# Patient Record
Sex: Male | Born: 1970 | Race: White | Hispanic: No | Smoking: Current every day smoker
Health system: Southern US, Community
[De-identification: ages and names within clinical notes are randomized; demographics above are authoritative.]

---

## 2008-03-23 ENCOUNTER — Other Ambulatory Visit: Payer: Self-pay

## 2008-03-23 ENCOUNTER — Emergency Department: Payer: Self-pay | Admitting: Emergency Medicine

## 2010-02-02 ENCOUNTER — Emergency Department: Payer: Self-pay | Admitting: Emergency Medicine

## 2011-05-22 ENCOUNTER — Emergency Department: Payer: Self-pay | Admitting: Emergency Medicine

## 2013-06-29 ENCOUNTER — Emergency Department: Payer: Self-pay | Admitting: Emergency Medicine

## 2013-08-31 ENCOUNTER — Encounter (HOSPITAL_COMMUNITY): Payer: Self-pay | Admitting: Emergency Medicine

## 2013-08-31 ENCOUNTER — Emergency Department (HOSPITAL_COMMUNITY)
Admission: EM | Admit: 2013-08-31 | Discharge: 2013-08-31 | Disposition: A | Discharge status: Home / Self Care | Payer: Self-pay | Attending: Emergency Medicine | Admitting: Emergency Medicine

## 2013-08-31 ENCOUNTER — Emergency Department: Payer: Self-pay | Admitting: Emergency Medicine

## 2013-08-31 DIAGNOSIS — M545 Low back pain, unspecified: Secondary | ICD-10-CM | POA: Insufficient documentation

## 2013-08-31 DIAGNOSIS — Z888 Allergy status to other drugs, medicaments and biological substances status: Secondary | ICD-10-CM | POA: Insufficient documentation

## 2013-08-31 DIAGNOSIS — F17200 Nicotine dependence, unspecified, uncomplicated: Secondary | ICD-10-CM | POA: Insufficient documentation

## 2013-08-31 DIAGNOSIS — G8929 Other chronic pain: Secondary | ICD-10-CM | POA: Insufficient documentation

## 2013-08-31 DIAGNOSIS — M5430 Sciatica, unspecified side: Secondary | ICD-10-CM | POA: Insufficient documentation

## 2013-08-31 DIAGNOSIS — Z79899 Other long term (current) drug therapy: Secondary | ICD-10-CM | POA: Insufficient documentation

## 2013-08-31 DIAGNOSIS — Y9389 Activity, other specified: Secondary | ICD-10-CM | POA: Insufficient documentation

## 2013-08-31 DIAGNOSIS — Y929 Unspecified place or not applicable: Secondary | ICD-10-CM | POA: Insufficient documentation

## 2013-08-31 DIAGNOSIS — M79609 Pain in unspecified limb: Secondary | ICD-10-CM | POA: Insufficient documentation

## 2013-08-31 DIAGNOSIS — X500XXA Overexertion from strenuous movement or load, initial encounter: Secondary | ICD-10-CM | POA: Insufficient documentation

## 2013-08-31 MED ORDER — TRAMADOL HCL 50 MG PO TABS: ORAL_TABLET | ORAL | Status: DC

## 2013-08-31 MED ORDER — OXYCODONE-ACETAMINOPHEN 5-325 MG PO TABS: 1.0000 | ORAL_TABLET | ORAL | Status: DC | PRN

## 2013-08-31 MED ORDER — ONDANSETRON HCL 4 MG PO TABS: 4.0000 mg | ORAL_TABLET | Freq: Four times a day (QID) | ORAL | Status: DC

## 2013-08-31 MED ORDER — DICLOFENAC SODIUM 75 MG PO TBEC: 75.0000 mg | DELAYED_RELEASE_TABLET | Freq: Two times a day (BID) | ORAL | Status: DC

## 2013-08-31 MED ORDER — METHOCARBAMOL 500 MG PO TABS: 500.0000 mg | ORAL_TABLET | Freq: Three times a day (TID) | ORAL | Status: DC

## 2013-08-31 MED ORDER — DEXAMETHASONE 4 MG PO TABS: ORAL_TABLET | ORAL | Status: DC

## 2013-08-31 NOTE — ED Notes (Signed)
Pt was picking up water heater 3 weeks ago and injured lower back and reports pain radiates down right leg

## 2013-08-31 NOTE — ED Notes (Signed)
Pt seen by PA and ready to be seen .

## 2013-08-31 NOTE — ED Notes (Signed)
Pt upset about the rx s that are ordered,Says muscle relaxants do not help.  Threw his d/c papers down and left.

## 2013-08-31 NOTE — Discharge Instructions (Signed)
Heat to your back may be helpful. Please see [facility] at [facility] to establish a PCP, they can manage you condition and pain. Use medications as suggested. Tramadol may cause drowsiness, use with caution. Back Pain, Adult Back pain is very common. The pain often gets better over time. The cause of back pain is usually not dangerous. Most people can learn to manage their back pain on their own.  HOME CARE   Stay active. Start with short walks on flat ground if you can. Try to walk farther each day.  Do not sit, drive, or stand in one place for more than 30 minutes. Do not stay in bed.  Do not avoid exercise or work. Activity can help your back heal faster.  Be careful when you bend or lift an object. Bend at your knees, keep the object close to you, and do not twist.  Sleep on a firm mattress. Lie on your side, and bend your knees. If you lie on your back, put a pillow under your knees.  Only take medicines as told by your doctor.  Put ice on the injured area.  Put ice in a plastic bag.  Place a towel between your skin and the bag.  Leave the ice on for 15-20 minutes, 03-04 times a day for the first 2 to 3 days. After that, you can switch between ice and heat packs.  Ask your doctor about back exercises or massage.  Avoid feeling anxious or stressed. Find good ways to deal with stress, such as exercise. GET HELP RIGHT AWAY IF:   Your pain does not go away with rest or medicine.  Your pain does not go away in 1 week.  You have new problems.  You do not feel well.  The pain spreads into your legs.  You cannot control when you poop (bowel movement) or pee (urinate).  Your arms or legs feel weak or lose feeling (numbness).  You feel sick to your stomach (nauseous) or throw up (vomit).  You have belly (abdominal) pain.  You feel like you may pass out (faint). MAKE SURE YOU:   Understand these instructions.  Will watch your condition.  Will get help right away  if you are not doing well or get worse. Document Released: 01/16/2008 Document Revised: 10/22/2011 Document Reviewed: 12/18/2010 [facility] Patient Information 2014 [place], [place].

## 2013-08-31 NOTE — Discharge Instructions (Signed)
Back Pain, Adult Low back pain is very common. About 1 in 5 people have back pain.The cause of low back pain is rarely dangerous. The pain often gets better over time.About half of people with a sudden onset of back pain feel better in just 2 weeks. About 8 in 10 people feel better by 6 weeks.  CAUSES Some common causes of back pain include:  Strain of the muscles or ligaments supporting the spine.  Wear and tear (degeneration) of the spinal discs.  Arthritis.  Direct injury to the back. DIAGNOSIS Most of the time, the direct cause of low back pain is not known.However, back pain can be treated effectively even when the exact cause of the pain is unknown.Answering your caregiver's questions about your overall health and symptoms is one of the most accurate ways to make sure the cause of your pain is not dangerous. If your caregiver needs more information, he or she may order lab work or imaging tests (X-rays or MRIs).However, even if imaging tests show changes in your back, this usually does not require surgery. HOME CARE INSTRUCTIONS For many people, back pain returns.Since low back pain is rarely dangerous, it is often a condition that people can learn to manageon their own.   Remain active. It is stressful on the back to sit or stand in one place. Do not sit, drive, or stand in one place for more than 30 minutes at a time. Take short walks on level surfaces as soon as pain allows.Try to increase the length of time you walk each day.  Do not stay in bed.Resting more than 1 or 2 days can delay your recovery.  Do not avoid exercise or work.Your body is made to move.It is not dangerous to be active, even though your back may hurt.Your back will likely heal faster if you return to being active before your pain is gone.  Pay attention to your body when you bend and lift. Many people have less discomfortwhen lifting if they bend their knees, keep the load close to their bodies,and  avoid twisting. Often, the most comfortable positions are those that put less stress on your recovering back.  Find a comfortable position to sleep. Use a firm mattress and lie on your side with your knees slightly bent. If you lie on your back, put a pillow under your knees.  Only take over-the-counter or prescription medicines as directed by your caregiver. Over-the-counter medicines to reduce pain and inflammation are often the most helpful.Your caregiver may prescribe muscle relaxant drugs.These medicines help dull your pain so you can more quickly return to your normal activities and healthy exercise.  Put ice on the injured area.  Put ice in a plastic bag.  Place a towel between your skin and the bag.  Leave the ice on for 15-20 minutes, 03-04 times a day for the first 2 to 3 days. After that, ice and heat may be alternated to reduce pain and spasms.  Ask your caregiver about trying back exercises and gentle massage. This may be of some benefit.  Avoid feeling anxious or stressed.Stress increases muscle tension and can worsen back pain.It is important to recognize when you are anxious or stressed and learn ways to manage it.Exercise is a great option. SEEK MEDICAL CARE IF:  You have pain that is not relieved with rest or medicine.  You have pain that does not improve in 1 week.  You have new symptoms.  You are generally not feeling well. SEEK   IMMEDIATE MEDICAL CARE IF:   You have pain that radiates from your back into your legs.  You develop new bowel or bladder control problems.  You have unusual weakness or numbness in your arms or legs.  You develop nausea or vomiting.  You develop abdominal pain.  You feel faint. Document Released: 07/30/2005 Document Revised: 01/29/2012 Document Reviewed: 12/18/2010 [employer] Patient Information 2014 [employer].  

## 2013-08-31 NOTE — ED Provider Notes (Signed)
CSN: [id number]     Arrival date & time 08/31/13  1735 History   This chart was scribed for non-physician practitioner Antony Madura, PA-C working with Hurman Horn, MD by Donne Anon, ED Scribe. This patient was seen in room TR10C/TR10C and the patient's care was started at 1933.   First MD Initiated Contact with Patient 08/31/13 1933     Chief Complaint  Patient presents with  . Back Pain    The history is provided by the patient. No language interpreter was used.   HPI Comments: Cody Mercado is a 43 y.o. male who presents to the Emergency Department complaining of 3 years of chronic, moderate, constant lower back pain that radiates to his right thigh and acutely worsened over the past 3 weeks when he was picking up a water heater. He was seen at [facility] ED earlier today for the same symptoms, where he was discharged home with prescriptions for diclofenac and tramadol. Movement and bending make the pain worse. He has tried Tylenol and ibuprofen with mild relief. He denies numbness or weakness in his lower extremities, bladder or bowel incontinence, numbness in his genital region, dysuria, fever or any other symptoms. He denies a history of cancer or IV drug use. Pt is requesting Percocet.   He does not currently have a PCP.  History reviewed. No pertinent past medical history. History reviewed. No pertinent past surgical history. No family history on file. History  Substance Use Topics  . Smoking status: Current Every Day Smoker    Types: Cigarettes  . Smokeless tobacco: Not on file  . Alcohol Use: No    Review of Systems  Constitutional: Negative for fever.  Genitourinary: Negative for dysuria.  Musculoskeletal: Positive for back pain.  Neurological: Negative for weakness and numbness.  All other systems reviewed and are negative.    Allergies  Tramadol  Home Medications   Current Outpatient Rx  Name  Route  Sig  Dispense  Refill  . acetaminophen (TYLENOL) 500 MG  tablet   Oral   Take 1,000 mg by mouth every 6 (six) hours as needed.         . diclofenac (VOLTAREN) 75 MG EC tablet   Oral   Take 1 tablet (75 mg total) by mouth 2 (two) times daily.   12 tablet   0   . methocarbamol (ROBAXIN) 500 MG tablet   Oral   Take 1 tablet (500 mg total) by mouth 3 (three) times daily.   21 tablet   0   . ondansetron (ZOFRAN) 4 MG tablet   Oral   Take 1 tablet (4 mg total) by mouth every 6 (six) hours.   12 tablet   0   . oxyCODONE-acetaminophen (PERCOCET/ROXICET) 5-325 MG per tablet   Oral   Take 1-2 tablets by mouth every 4 (four) hours as needed for severe pain.   2 tablet   0    BP 138/71  Pulse 97  Temp(Src) 97.6 F (36.4 C) (Oral)  Resp 18  Wt 195 lb (88.451 kg)  SpO2 97%  Physical Exam  Nursing note and vitals reviewed. Constitutional: He is oriented to person, place, and time. He appears well-developed and well-nourished. No distress.  HENT:  Head: Normocephalic and atraumatic.  Eyes: Conjunctivae and EOM are normal. No scleral icterus.  Neck: Normal range of motion.  Cardiovascular: Normal rate, regular rhythm and intact distal pulses.   DP and PT pulses 2+ bilaterally  Pulmonary/Chest: Effort normal. No respiratory  distress.  Musculoskeletal: Normal range of motion.  She's tenderness to palpation of the lumbosacral paraspinal muscles. No bony deformities or step-offs palpated. No bony tenderness appreciated. Limited range of motion secondary only to pain. Patient does not appear to have a positive straight leg raise as he resists with normal muscle strength during the entirety of this testing. Negative crossed straight leg raise.  Neurological: He is alert and oriented to person, place, and time. He has normal reflexes.  No sensory deficits appreciated. Patient ambulatory with normal gait.  Skin: Skin is warm and dry. No rash noted. He is not diaphoretic. No erythema. No pallor.  Psychiatric: He has a normal mood and affect.  His behavior is normal.    ED Course  Procedures (including critical care time) DIAGNOSTIC STUDIES: Oxygen Saturation is 97% on RA, adequate by my interpretation.    COORDINATION OF CARE: 8:09 PM Discussed treatment plan which includes 2 tablets of Percocet and Zofran with pt at bedside and pt agreed to plan. Encouraged pt to fill prescriptions for decadron and diclofenac and tramadol from earlier today.  Labs Review Labs Reviewed - No data to display Imaging Review No results found.  EKG Interpretation   None       MDM   1. Low back pain    Uncomplicated low-back pain. Patient well and nontoxic appearing, hemodynamically stable, and afebrile. He is neurovascularly intact on physical exam and ambulatory. No gross sensory deficits appreciated. No reflexive signs concerning for cauda equina. Do not believe emergent imaging is indicated.   [facility]. Patient became frustrated when he was not prescribed Percocet at [facility]. He specifically requests Percocet today as he states it helped him in the past. I've explained to the patient that he needs to try what was prescribed to him. He complains that tramadol makes him nauseous. I stated that I will prescribe and Zofran for this to take 30 minutes prior to tramadol. Patient has been given prescription for 2 tablets of Percocet; he states he drove to the emergency department and this is what I would have given for acute pain control in the ED. Return precautions provided and patient agreeable to plan with no unaddressed concerns.  I personally performed the services described in this documentation, which was scribed in my presence. The recorded information has been reviewed and is accurate.    Antony MaduraKelly Erik Burkett, PA-C 08/31/13 2018

## 2013-08-31 NOTE — ED Provider Notes (Signed)
CSN: [id number]     Arrival date & time 08/31/13  1424 History   First MD Initiated Contact with Patient 08/31/13 1600     Chief Complaint  Patient presents with  . Back Pain   (Consider location/radiation/quality/duration/timing/severity/associated sxs/prior Treatment) Patient is a 43 y.o. male presenting with back pain. The history is provided by the patient.  Back Pain Location:  Lumbar spine Quality:  Aching and cramping Radiates to:  R thigh Pain severity:  Moderate Pain is:  Same all the time Duration: acute on chronic.. Worse for the past 2 weeks. Timing:  Intermittent Progression:  Worsening Chronicity:  Chronic Context comment:  Pt was told 3 yrs ago he may have a "pinched nerve". No follow up since that time. Relieved by:  Nothing Worsened by:  Movement and bending Ineffective treatments:  OTC medications Associated symptoms: no abdominal pain, no bladder incontinence, no bowel incontinence, no chest pain, no dysuria and no perianal numbness   Risk factors: no recent surgery     History reviewed. No pertinent past medical history. History reviewed. No pertinent past surgical history. No family history on file. History  Substance Use Topics  . Smoking status: Current Every Day Smoker    Types: Cigarettes  . Smokeless tobacco: Not on file  . Alcohol Use: No    Review of Systems  Constitutional: Negative for activity change.       All ROS Neg except as noted in HPI  HENT: Negative for nosebleeds.   Eyes: Negative for photophobia and discharge.  Respiratory: Negative for cough, shortness of breath and wheezing.   Cardiovascular: Negative for chest pain and palpitations.  Gastrointestinal: Negative for abdominal pain, blood in stool and bowel incontinence.  Genitourinary: Negative for bladder incontinence, dysuria, frequency and hematuria.  Musculoskeletal: Positive for back pain. Negative for arthralgias and neck pain.  Skin: Negative.   Neurological: Negative  for dizziness, seizures and speech difficulty.  Psychiatric/Behavioral: Negative for hallucinations and confusion.    Allergies  Review of patient's allergies indicates no known allergies.  Home Medications  No current outpatient prescriptions on file. BP 107/56  Pulse 98  Temp(Src) 97.4 F (36.3 C) (Oral)  Resp 18  Ht 6\' 3"  (1.905 m)  Wt 195 lb (88.451 kg)  BMI 24.37 kg/m2  SpO2 100% Physical Exam  Nursing note and vitals reviewed. Constitutional: He is oriented to person, place, and time. He appears well-developed and well-nourished.  Non-toxic appearance.  HENT:  Head: Normocephalic.  Right Ear: Tympanic membrane and external ear normal.  Left Ear: Tympanic membrane and external ear normal.  Eyes: EOM and lids are normal. Pupils are equal, round, and reactive to light.  Neck: Normal range of motion. Neck supple. Carotid bruit is not present.  Cardiovascular: Normal rate, regular rhythm, normal heart sounds, intact distal pulses and normal pulses.   Pulmonary/Chest: Breath sounds normal. No respiratory distress.  Abdominal: Soft. Bowel sounds are normal. There is no tenderness. There is no guarding.  Musculoskeletal:       Lumbar back: He exhibits decreased range of motion, tenderness and pain.  Right back pain with SLR at 40 degrees.  Lymphadenopathy:       Head (right side): No submandibular adenopathy present.       Head (left side): No submandibular adenopathy present.    He has no cervical adenopathy.  Neurological: He is alert and oriented to person, place, and time. He has normal strength. No cranial nerve deficit or sensory deficit.  Skin: Skin is  warm and dry.  Psychiatric: He has a normal mood and affect. His speech is normal.    ED Course  Procedures (including critical care time) Labs Review Labs Reviewed - No data to display Imaging Review No results found.  EKG Interpretation   None       MDM  No diagnosis found. *I have reviewed nursing notes,  vital signs, and all appropriate lab and imaging results for this patient.**  Pt has hx of back problems. 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 No recheck or follow up reported for his back.  No gross neuro deficit noted on exam today. Rx for decadron and diclofenac and tramadol given to the patient. Pt strongly encouraged to follow up with PCP or orthopedics.  Kathie DikeHobson M Corrinne Benegas, PA-C 08/31/13 1623

## 2013-08-31 NOTE — ED Notes (Signed)
Pt reports back pain that started 2 weeks ago, pain radiates down his right leg. Injured while lifting a water heater

## 2013-08-31 NOTE — ED Notes (Signed)
Pt states that he was seen at [facility] for back pain 2 [facility] Pt states given percocet for his lower back pain. Pt ambulatory.

## 2013-08-31 NOTE — ED Notes (Signed)
At time of d/c pt says he cannot take tramadol, caused stomach upset

## 2013-08-31 NOTE — ED Provider Notes (Signed)
Medical screening examination/treatment/procedure(s) were performed by non-physician practitioner and as supervising physician I was immediately available for consultation/collaboration.  EKG Interpretation   None         Benny LennertJoseph L Therin Vetsch, MD 08/31/13 2332

## 2013-09-03 NOTE — ED Provider Notes (Signed)
Medical screening examination/treatment/procedure(s) were performed by non-physician practitioner and as supervising physician I was immediately available for consultation/collaboration.  Hurman HornJohn M Callan Yontz, MD 09/03/13 [phone]

## 2015-03-19 DIAGNOSIS — Z720 Tobacco use: Secondary | ICD-10-CM | POA: Insufficient documentation

## 2015-03-19 DIAGNOSIS — Z791 Long term (current) use of non-steroidal anti-inflammatories (NSAID): Secondary | ICD-10-CM | POA: Insufficient documentation

## 2015-03-19 DIAGNOSIS — Y998 Other external cause status: Secondary | ICD-10-CM | POA: Insufficient documentation

## 2015-03-19 DIAGNOSIS — Y9289 Other specified places as the place of occurrence of the external cause: Secondary | ICD-10-CM | POA: Insufficient documentation

## 2015-03-19 DIAGNOSIS — R079 Chest pain, unspecified: Secondary | ICD-10-CM | POA: Insufficient documentation

## 2015-03-19 DIAGNOSIS — Z79899 Other long term (current) drug therapy: Secondary | ICD-10-CM | POA: Insufficient documentation

## 2015-03-19 DIAGNOSIS — S51811A Laceration without foreign body of right forearm, initial encounter: Secondary | ICD-10-CM | POA: Insufficient documentation

## 2015-03-19 DIAGNOSIS — W01198A Fall on same level from slipping, tripping and stumbling with subsequent striking against other object, initial encounter: Secondary | ICD-10-CM | POA: Insufficient documentation

## 2015-03-19 DIAGNOSIS — Y9301 Activity, walking, marching and hiking: Secondary | ICD-10-CM | POA: Insufficient documentation

## 2015-03-19 NOTE — ED Notes (Signed)
Pt reports laceration to his right forearm. Pt reports cut on a broken toilet while replacing it.

## 2015-03-20 ENCOUNTER — Emergency Department
Admission: EM | Admit: 2015-03-20 | Discharge: 2015-03-20 | Disposition: A | Discharge status: Home / Self Care | Payer: Self-pay | Attending: Emergency Medicine | Admitting: Emergency Medicine

## 2015-03-20 DIAGNOSIS — IMO0002 Reserved for concepts with insufficient information to code with codable children: Secondary | ICD-10-CM

## 2015-03-20 MED ORDER — BUPIVACAINE HCL (PF) 0.5 % IJ SOLN: 20.0000 mL | Freq: Once | INTRAMUSCULAR | Status: DC

## 2015-03-20 MED ORDER — LIDOCAINE-EPINEPHRINE (PF) 1 %-1:200000 IJ SOLN
INTRAMUSCULAR | Status: AC
  Filled 2015-03-20: qty 30

## 2015-03-20 MED ORDER — BUPIVACAINE HCL (PF) 0.5 % IJ SOLN
INTRAMUSCULAR | Status: AC
  Filled 2015-03-20: qty 30

## 2015-03-20 MED ORDER — LIDOCAINE-EPINEPHRINE 1 %-1:100000 IJ SOLN: 10.0000 mL | Freq: Once | INTRAMUSCULAR | Status: DC

## 2015-03-20 NOTE — Discharge Instructions (Signed)
Please seek medical attention for any high fevers, chest pain, shortness of breath, change in behavior, persistent vomiting, bloody stool or any other new or concerning symptoms. ° °Laceration Care, Adult °A laceration is a cut or lesion that goes through all layers of the skin and into the tissue just beneath the skin. °TREATMENT  °Some lacerations may not require closure. Some lacerations may not be able to be closed due to an increased risk of infection. It is important to see your caregiver as soon as possible after an injury to minimize the risk of infection and maximize the opportunity for successful closure. °If closure is appropriate, pain medicines may be given, if needed. The wound will be cleaned to help prevent infection. Your caregiver will use stitches (sutures), staples, wound glue (adhesive), or skin adhesive strips to repair the laceration. These tools bring the skin edges together to allow for faster healing and a better cosmetic outcome. However, all wounds will heal with a scar. Once the wound has healed, scarring can be minimized by covering the wound with sunscreen during the day for 1 full year. °HOME CARE INSTRUCTIONS  °For sutures or staples: °· Keep the wound clean and dry. °· If you were given a bandage (dressing), you should change it at least once a day. Also, change the dressing if it becomes wet or dirty, or as directed by your caregiver. °· Wash the wound with soap and water 2 times a day. Rinse the wound off with water to remove all soap. Pat the wound dry with a clean towel. °· After cleaning, apply a thin layer of the antibiotic ointment as recommended by your caregiver. This will help prevent infection and keep the dressing from sticking. °· You may shower as usual after the first 24 hours. Do not soak the wound in water until the sutures are removed. °· Only take over-the-counter or prescription medicines for pain, discomfort, or fever as directed by your caregiver. °· Get your  sutures or staples removed as directed by your caregiver. °For skin adhesive strips: °· Keep the wound clean and dry. °· Do not get the skin adhesive strips wet. You may bathe carefully, using caution to keep the wound dry. °· If the wound gets wet, pat it dry with a clean towel. °· Skin adhesive strips will fall off on their own. You may trim the strips as the wound heals. Do not remove skin adhesive strips that are still stuck to the wound. They will fall off in time. °For wound adhesive: °· You may briefly wet your wound in the shower or bath. Do not soak or scrub the wound. Do not swim. Avoid periods of heavy perspiration until the skin adhesive has fallen off on its own. After showering or bathing, gently pat the wound dry with a clean towel. °· Do not apply liquid medicine, cream medicine, or ointment medicine to your wound while the skin adhesive is in place. This may loosen the film before your wound is healed. °· If a dressing is placed over the wound, be careful not to apply tape directly over the skin adhesive. This may cause the adhesive to be pulled off before the wound is healed. °· Avoid prolonged exposure to sunlight or tanning lamps while the skin adhesive is in place. Exposure to ultraviolet light in the first year will darken the scar. °· The skin adhesive will usually remain in place for 5 to 10 days, then naturally fall off the skin. Do not pick at   the adhesive film. °You may need a tetanus shot if: °· You cannot remember when you had your last tetanus shot. °· You have never had a tetanus shot. °If you get a tetanus shot, your arm may swell, get red, and feel warm to the touch. This is common and not a problem. If you need a tetanus shot and you choose not to have one, there is a rare chance of getting tetanus. Sickness from tetanus can be serious. °SEEK MEDICAL CARE IF:  °· You have redness, swelling, or increasing pain in the wound. °· You see a red line that goes away from the wound. °· You  have yellowish-white fluid (pus) coming from the wound. °· You have a fever. °· You notice a bad smell coming from the wound or dressing. °· Your wound breaks open before or after sutures have been removed. °· You notice something coming out of the wound such as wood or glass. °· Your wound is on your hand or foot and you cannot move a finger or toe. °SEEK IMMEDIATE MEDICAL CARE IF:  °· Your pain is not controlled with prescribed medicine. °· You have severe swelling around the wound causing pain and numbness or a change in color in your arm, hand, leg, or foot. °· Your wound splits open and starts bleeding. °· You have worsening numbness, weakness, or loss of function of any joint around or beyond the wound. °· You develop painful lumps near the wound or on the skin anywhere on your body. °MAKE SURE YOU:  °· Understand these instructions. °· Will watch your condition. °· Will get help right away if you are not doing well or get worse. °Document Released: 07/30/2005 Document Revised: 10/22/2011 Document Reviewed: 01/23/2011 °Exit[employer]® Patient Information ©2015 [employer]. This information is not intended to replace advice given to you by your health care provider. Make sure you discuss any questions you have with your health care provider. ° °

## 2015-03-20 NOTE — ED Notes (Signed)
Pt left ER AMA. MD spoke with pt on further treatment in regards to laceration. Pt continued to state he wanted to leave due to employment obligations. Area cleaned with NS and bandage prior to departure.

## 2015-03-20 NOTE — ED Provider Notes (Signed)
[facility] Emergency Department Provider Note    ____________________________________________  Time seen: 0057  I have reviewed the triage vital signs and the nursing notes.   HISTORY  Chief Complaint Laceration   History limited by: Not Limited   HPI Cody Mercado is a 44 y.o. male who presents to the emergency department today because of laceration. Patient states that he was walking and carrying a toilet when he fell. He severed a laceration to his right forearm. Additionally states he is having some pain around his right ribs. He denies any difficulty breathing. Denies any cough. Denies any other injury. He states his last tetanus shot was 4 years ago.   No past medical history on file.  There are no active problems to display for this patient.   No past surgical history on file.  Current Outpatient Rx  Name  Route  Sig  Dispense  Refill  . acetaminophen (TYLENOL) 500 MG tablet   Oral   Take 1,000 mg by mouth every 6 (six) hours as needed.         . diclofenac (VOLTAREN) 75 MG EC tablet   Oral   Take 1 tablet (75 mg total) by mouth 2 (two) times daily.   12 tablet   0   . methocarbamol (ROBAXIN) 500 MG tablet   Oral   Take 1 tablet (500 mg total) by mouth 3 (three) times daily.   21 tablet   0   . ondansetron (ZOFRAN) 4 MG tablet   Oral   Take 1 tablet (4 mg total) by mouth every 6 (six) hours.   12 tablet   0   . oxyCODONE-acetaminophen (PERCOCET/ROXICET) 5-325 MG per tablet   Oral   Take 1-2 tablets by mouth every 4 (four) hours as needed for severe pain.   2 tablet   0     Allergies Tramadol  No family history on file.  Social History History  Substance Use Topics  . Smoking status: Current Every Day Smoker    Types: Cigarettes  . Smokeless tobacco: Not on file  . Alcohol Use: No    Review of Systems  Constitutional: Negative for fever. Cardiovascular: Right-sided chest pain Respiratory: Negative for  shortness of breath. Gastrointestinal: Negative for abdominal pain, vomiting and diarrhea. Genitourinary: Negative for dysuria. Musculoskeletal: Negative for back pain. Skin: Negative for rash. Laceration to right forearm Neurological: Negative for headaches, focal weakness or numbness.   10-point ROS otherwise negative.  ____________________________________________   PHYSICAL EXAM:  VITAL SIGNS: ED Triage Vitals  Enc Vitals Group     BP 03/19/15 2247 108/53 mmHg     Pulse Rate 03/19/15 2247 56     Resp 03/19/15 2247 18     Temp 03/19/15 2247 98.4 F (36.9 C)     Temp Source 03/19/15 2247 Oral     SpO2 03/19/15 2247 98 %     Weight 03/19/15 2247 195 lb (88.451 kg)     Height 03/19/15 2247 6\' 3"  (1.905 m)     Head Cir --      Peak Flow --      Pain Score 03/19/15 2248 8   Constitutional: Alert and oriented. Well appearing and in no distress. Eyes: Conjunctivae are normal. PERRL. Normal extraocular movements. ENT   Head: Normocephalic and atraumatic.   Nose: No congestion/rhinnorhea.   Mouth/Throat: Mucous membranes are moist.   Neck: No stridor. Hematological/Lymphatic/Immunilogical: No cervical lymphadenopathy. Cardiovascular: Normal rate, regular rhythm.  No murmurs, rubs,  or gallops. Respiratory: Normal respiratory effort without tachypnea nor retractions. Breath sounds are clear and equal bilaterally. No wheezes/rales/rhonchi. Gastrointestinal: Soft and nontender. No distention. Genitourinary: Deferred Musculoskeletal: Normal range of motion in all extremities. No joint effusions.  No lower extremity tenderness nor edema. Neurologic:  Normal speech and language. No gross focal neurologic deficits are appreciated. Speech is normal.  Skin:  Skin is warm, dry. Roughly 3-1/2 cm laceration to the right forearm. Hemostatic. Psychiatric: Mood and affect are normal. Speech and behavior are normal. Patient exhibits appropriate insight and  judgment.  ____________________________________________    LABS (pertinent positives/negatives)  None  ____________________________________________   EKG  None  ____________________________________________    RADIOLOGY  None  ____________________________________________   PROCEDURES  Procedure(s) performed: None  Critical Care performed: No   ____________________________________________   INITIAL IMPRESSION / ASSESSMENT AND PLAN / ED COURSE  Pertinent labs & imaging results that were available during my care of the patient were reviewed by me and considered in my medical decision making (see chart for details).  Patient presents to the emergency department today with after suffering a laceration to the right forearm. On exam patient does have a 3 and half centimeter laceration to the right forearm. That is hemostatic. Neurovascularly intact distally. The plan was to repair the laceration with sutures however patient chose to leave the emergency department before this could be done. I did explain to patient that he will be at increased risk for infection, cosmetic issues. Patient verbalizes understanding however stated that he just wanted to get it wrapped go home and go to bed because he can't work tomorrow morning.  ____________________________________________   FINAL CLINICAL IMPRESSION(S) / ED DIAGNOSES  Final diagnoses:  Laceration     Phineas Semen, MD 03/20/15 [phone]

## 2015-05-20 ENCOUNTER — Encounter (HOSPITAL_COMMUNITY): Payer: Self-pay | Admitting: Emergency Medicine

## 2015-05-20 ENCOUNTER — Emergency Department (HOSPITAL_COMMUNITY)
Admission: EM | Admit: 2015-05-20 | Discharge: 2015-05-21 | Disposition: A | Discharge status: Home / Self Care | Payer: Self-pay | Attending: Emergency Medicine | Admitting: Emergency Medicine

## 2015-05-20 DIAGNOSIS — Z791 Long term (current) use of non-steroidal anti-inflammatories (NSAID): Secondary | ICD-10-CM | POA: Insufficient documentation

## 2015-05-20 DIAGNOSIS — Z79899 Other long term (current) drug therapy: Secondary | ICD-10-CM | POA: Insufficient documentation

## 2015-05-20 DIAGNOSIS — Z720 Tobacco use: Secondary | ICD-10-CM | POA: Insufficient documentation

## 2015-05-20 DIAGNOSIS — F1120 Opioid dependence, uncomplicated: Secondary | ICD-10-CM | POA: Insufficient documentation

## 2015-05-20 DIAGNOSIS — F329 Major depressive disorder, single episode, unspecified: Secondary | ICD-10-CM | POA: Insufficient documentation

## 2015-05-20 NOTE — ED Notes (Addendum)
Pt reports he has been taking 150-200mg  of oxycodone a day for the last few years. Reports last dose of 80 mg was last night at 1900. Also reports doing heroin; although, he states last use was a month ago. Requests inpatient detox. Denies SI/HI
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
## 2015-05-21 NOTE — ED Notes (Signed)
Bed: WTR5 Expected date:  Expected time:  Means of arrival:  Comments: 

## 2015-05-21 NOTE — ED Notes (Signed)
MD at bedside. 

## 2015-05-21 NOTE — ED Notes (Signed)
Pertinent information (labs and vital signs) faxed to [facility] as requested by family; sister on phone with [facility]

## 2015-05-21 NOTE — ED Provider Notes (Signed)
CSN: [id number]     Arrival date & time 05/20/15  2052 History   First MD Initiated Contact with Patient 05/21/15 0008     Chief Complaint  Patient presents with  . Addiction Problem     (Consider location/radiation/quality/duration/timing/severity/associated sxs/prior Treatment) HPI Comments: Patient presents with request for treatment of narcotic dependence. He denies SI/HI, hallucinations. He sought treatment from [facility] last night but reported that the placement they found for him was nothing they could afford. He denies N, V, pain.  The history is provided by the patient. No language interpreter was used.    History reviewed. No pertinent past medical history. History reviewed. No pertinent past surgical history. No family history on file. Social History  Substance Use Topics  . Smoking status: Current Every Day Smoker    Types: Cigarettes  . Smokeless tobacco: None  . Alcohol Use: Yes     Comment: occasionally    Review of Systems  Constitutional: Negative for fever and chills.  HENT: Negative.   Respiratory: Negative.   Cardiovascular: Negative.   Gastrointestinal: Negative.   Musculoskeletal: Negative.   Skin: Negative.   Neurological: Negative.   Psychiatric/Behavioral: Positive for dysphoric mood.      Allergies  Tramadol  Home Medications   Prior to Admission medications   Medication Sig Start Date End Date Taking? Authorizing Provider  acetaminophen (TYLENOL) 500 MG tablet Take 1,000 mg by mouth every 6 (six) hours as needed.    Historical Provider, MD  diclofenac (VOLTAREN) 75 MG EC tablet Take 1 tablet (75 mg total) by mouth 2 (two) times daily. 08/31/13   Ivery Quale, PA-C  methocarbamol (ROBAXIN) 500 MG tablet Take 1 tablet (500 mg total) by mouth 3 (three) times daily. 08/31/13   Ivery Quale, PA-C  ondansetron (ZOFRAN) 4 MG tablet Take 1 tablet (4 mg total) by mouth every 6 (six) hours. 08/31/13   Antony Madura, PA-C  oxyCODONE-acetaminophen  (PERCOCET/ROXICET) 5-325 MG per tablet Take 1-2 tablets by mouth every 4 (four) hours as needed for severe pain. 08/31/13   Antony Madura, PA-C   BP 109/52 mmHg  Pulse 82  Temp(Src) 97.4 F (36.3 C) (Oral)  Resp 19  SpO2 98% Physical Exam  Constitutional: He is oriented to person, place, and time. He appears well-developed and well-nourished.  HENT:  Head: Normocephalic.  Neck: Normal range of motion. Neck supple.  Cardiovascular: Normal rate and regular rhythm.   Pulmonary/Chest: Effort normal and breath sounds normal.  Abdominal: Soft. Bowel sounds are normal. There is no tenderness. There is no rebound and no guarding.  Musculoskeletal: Normal range of motion.  Neurological: He is alert and oriented to person, place, and time.  Skin: Skin is warm and dry. No rash noted.  Psychiatric: His speech is delayed. He is slowed and withdrawn. He exhibits a depressed mood.    ED Course  Procedures (including critical care time) Labs Review Labs Reviewed - No data to display  Imaging Review No results found. I have personally reviewed and evaluated these images and lab results as part of my medical decision-making.   EKG Interpretation None      MDM   Final diagnoses:  None    1. Polysubstance abuse  The patient was evaluated by TTS consultation and found to be stable for discharge for outpatient treatment. Resources provided.     Elpidio Anis, PA-C 05/21/15 0151  April Palumbo, MD 05/21/15 [phone]

## 2015-05-21 NOTE — Discharge Instructions (Signed)
Opioid Use Disorder °Opioid use disorder is a mental disorder. It is the continued nonmedical use of opioids in spite of risks to health and well-being. Misused opioids include the street drug heroin. They also include pain medicines such as morphine, hydrocodone, oxycodone, and fentanyl. Opioids are very addictive. People who misuse opioids get an exaggerated feeling of well-being. Opioid use disorder often disrupts activities at home, work, or school. It may cause mental or physical problems.  °A family history of opioid use disorder puts you at higher risk of it. People with opioid use disorder often misuse other drugs or have mental illness such as depression, posttraumatic stress disorder, or antisocial personality disorder. They also are at risk of suicide and death from overdose. °SIGNS AND SYMPTOMS  °Signs and symptoms of opioid use disorder include: °· Use of opioids in larger amounts or over a longer period than intended. °· Unsuccessful attempts to cut down or control opioid use. °· A lot of time spent obtaining, using, or recovering from the effects of opioids. °· A strong desire or urge to use opioids (craving). °· Continued use of opioids in spite of major problems at work, school, or home because of use. °· Continued use of opioids in spite of relationship problems because of use. °· Giving up or cutting down on important life activities because of opioid use. °· Use of opioids over and over in situations when it is physically hazardous, such as driving a car. °· Continued use of opioids in spite of a physical problem that is likely related to use. Physical problems can include: °· Severe constipation. °· Poor nutrition. °· Infertility. °· Tuberculosis. °· Aspiration pneumonia. °· Infections such as human immunodeficiency virus (HIV) and hepatitis (from injecting opioids). °· Continued use of opioids in spite of a mental problem that is likely related to use. Mental problems can  include: °· Depression. °· Anxiety. °· Hallucinations. °· Sleep problems. °· Loss of sexual function. °· Need to use more and more opioids to get the same effect, or lessened effect over time with use of the same amount (tolerance). °· Having withdrawal symptoms when opioid use is stopped, or using opioids to reduce or avoid withdrawal symptoms. Withdrawal symptoms include: °· Depressed, anxious, or irritable mood. °· Nausea, vomiting, diarrhea, or intestinal cramping. °· Muscle aches or spasms. °· Excessive tearing or runny nose. °· Dilated pupils, sweating, or hairs standing on end. °· Yawning. °· Fever, raised blood pressure, or fast pulse. °· Restlessness or trouble sleeping. This does not apply to people taking opioids for medical reasons only. °DIAGNOSIS °Opioid use disorder is diagnosed by your health care provider. You may be asked questions about your opioid use and and how it affects your life. A physical exam may be done. A drug screen may be ordered. You may be referred to a mental health professional. The diagnosis of opioid use disorder requires at least two symptoms within 12 months. The type of opioid use disorder you have depends on the number of signs and symptoms you have. The type may be: °· Mild. Two or three signs and symptoms.    °· Moderate. Four or five signs and symptoms.   °· Severe. Six or more signs and symptoms. °TREATMENT  °Treatment is usually provided by mental health professionals with training in substance use disorders. The following options are available: °· Detoxification. This is the first step in treatment for withdrawal. It is medically supervised withdrawal with the use of medicines. These medicines lessen withdrawal symptoms. They also raise the chance   of becoming opioid free.  Counseling, also known as talk therapy. Talk therapy addresses the reasons you use opioids. It also addresses ways to keep you from using again (relapse). The goals of talk therapy are to avoid  relapse by:  Identifying and avoiding triggers for use.  Finding healthy ways to cope with stress.  Learning how to handle cravings.  Support groups. Support groups provide emotional support, advice, and guidance.  A medicine that blocks opioid receptors in your brain. This medicine can reduce opioid cravings that lead to relapse. This medicine also blocks the desired opioid effect when relapse occurs.  Opioids that are taken by mouth in place of the misused opioid (opioid maintenance treatment). These medicines satisfy cravings but are safer than commonly misused opioids. This often is the best option for people who continue to relapse with other treatments. HOME CARE INSTRUCTIONS   Take medicines only as directed by your health care provider.  Check with your health care provider before starting new medicines.  Keep all follow-up visits as directed by your health care provider. SEEK MEDICAL CARE IF:  You are not able to take your medicines as directed.  Your symptoms get worse. SEEK IMMEDIATE MEDICAL CARE IF:  You have serious thoughts about hurting yourself or others.  You may have taken an overdose of opioids. FOR MORE INFORMATION  National Institute on Drug Abuse: http://www.price-smith.com/  Substance Abuse and Mental Health Services Administration: SkateOasis.com.pt   This information is not intended to replace advice given to you by your health care provider. Make sure you discuss any questions you have with your health care provider.   Document Released: 05/27/2007 Document Revised: 08/20/2014 Document Reviewed: 08/12/2013 Elsevier Interactive Patient Education 2016 [employer].  Finding Treatment for Addiction WHAT IS ADDICTION? Addiction is a complex disease of the brain. It causes an uncontrollable (compulsive) need for a substance. You can be addicted to alcohol, illegal drugs, or prescription medicines such as painkillers. Addiction can also be a behavior, like gambling  or shopping. The need for the drug or activity can become so strong that you think about it all the time. You can also become physically dependent on a substance. Addiction can change the way your brain works. Because of these changes, getting more of whatever you are addicted to becomes the most important thing to you and feels better than other activities or relationships. Addiction can lead to changes in health, behavior, emotions, relationships, and choices that affect you and everyone around you. HOW DO I KNOW IF I NEED TREATMENT FOR ADDICTION? Addiction is a progressive disease. Without treatment, addiction can get worse. Living with addiction puts you at higher risk for injury, poor health, lost employment, loss of money, and even death. You might need treatment for addiction if:  You have tried to stop or cut down, but you cannot.  Your addiction is causing physical health problems.  You find it annoying that your friends and family are concerned about your alcohol or substance use.  You feel guilty about substance abuse or a compulsive behavior.  You have lied or tried to hide your addiction.  You need a particular substance or activity to start your day or to calm down.  You are getting in trouble at school, work, home, or with the police.  You have done something illegal to support your addiction.  You are running out of money because of your addiction.  You have no time for anything other than your addiction. WHAT TYPES OF  TREATMENT ARE AVAILABLE? The treatment program that is right for you will depend on many factors, including the type of addiction you have. Treatment programs can be outpatient or inpatient. In an outpatient program, you live at home and go to work or school, but you also go to a clinic for treatment. With an inpatient program, you live and sleep at the program facility during treatment. After treatment, you might need a plan for support during recovery. Other  treatment options include:   Medicine.  Some addictions may be treated with prescription medicines.  You might also need medicine to treat anxiety or depression.  Counseling and behavior therapy. Therapy can help individuals and families behave in healthier ways and relate more effectively.  Support groups. Confidential group therapy, such as a 12-step program, can help individuals and families during treatment and recovery. No single type of program is right for everyone. Many treatment programs involve a combination of education, counseling, and a 12-step, spiritually-based approach. Some treatment programs are government sponsored. They are geared for patients who do not have private insurance. Treatment programs can vary in many respects, such as:  Cost and types of insurance that are accepted.  Types of on-site medical services that are offered.  Length of stay, setting, and size.  Overall philosophy of treatment. WHAT SHOULD I CONSIDER WHEN SELECTING A TREATMENT PROGRAM? It is important to think about your individual requirements when selecting a treatment program. There are a number of things to consider, such as:  If the program is certified by the appropriate government agency. Even private programs must be certified and employ certified professionals.  If the program is covered by your insurance. If finances are a concern, the first call you should make is to your insurance company, if you have health insurance. Ask for a list of treatment programs that are in your network, and confirm any copayments and deductibles that you may have to pay.  If you do not have insurance, or if you choose to attend a program that does not accept your insurance, discuss whether a payment plan can be set up.  If treatment is available in languages other than English, if needed.  If the program offers detoxification treatment, if needed.  If 12-step meetings are held at the center or if  transport is available for patients to attend meetings at other locations.  If the program is professional, organized, and clean.  If the program meets all of your needs, including physical and cultural needs.  If the facility offers specific treatment for your particular addiction.  If support continues to be offered after you have left the program.  If your treatment plan is continually looked at to make sure you are receiving the right treatment at the right time.  If mental health counseling is part of your treatment.  If medicine is included in treatment, if needed.  If your family is included in your treatment plan and if support is offered to them throughout the treatment process.  How the treatment works to prevent relapse. WHERE ELSE CAN I GET HELP?  Your health care provider. Ask him or her to help you find addiction treatment. These discussions are confidential.  The [employer] Council on Alcoholism and Drug Dependence (NCADD). This group has information about treatment centers and programs for people who have an addiction and for family members.  The telephone number is 1-800-NCA-CALL (224-388-13021-(661) 246-1627).  The website is https://ncadd.org/about-ncadd/our-affiliates  The Substance Abuse and Mental Health Services Administration [facility](SAMHSA). This  group will help you find publicly funded treatment centers, help hotlines, and counseling services near you.  The telephone number is 1-800-662-HELP (551-663-2893).  The website is www.findtreatment.RockToxic.pl In countries outside of the [place]. and [place], look in [employer] for contact information for services in your area.   This information is not intended to replace advice given to you by your health care provider. Make sure you discuss any questions you have with your health care provider.   Document Released: 06/28/2005 Document Revised: 04/20/2015 Document Reviewed: 05/18/2014 Elsevier Interactive Patient Education  2016 [employer]. Substance Abuse Treatment Programs  Intensive Outpatient Programs [facility]     [address]      [place], [place]                   [phone]       [facility] [address] [place] [address] [place], [place] [phone]  [facility]     (Inpatient and outpatient)     [address]           [phone]    [facility] [phone] (Suboxone and Methadone)  [address]      [place], [place] [address]      [phone]       [address] [place], [place] [phone]  [facility] (Outpatient/Inpatient, [facility])    (insurance only) [phone]             [facility] (Groups & Residential) [place], [place] [phone]     [facility]     [address]     [place], [place]      [phone]       [facility] (for caregivers and family) [phone] [address] [place], [place] [phone]      Residential Treatment Programs [facility]      [address], [place], [place] [address]  [phone]       [facility] [address] [place] , [place] [address] [phone]  [facility] [place]        [phone]       [facility] [phone]  [facility]             [address]                                         [place], [place]                                                [phone] or [phone]                               [facility] [place] [address] [place], [address] [phone]  [facility]    [address]      [place], [place]     [phone]       [facility] [address] [place], [place] [phone]  [facility]   [address] [place], [place] [address]     [phone]      Admissions: 8am-3pm  M-F  [facility] ([facility]) [address] [place], [place] [phone]  [facility]: Residential Program ([address]   [place], [place]      [phone] or [phone]     [facility]: [facility] [place], [place] (Walk in Hours over the weekend or by referral)  [facility] [address] [place], [place], [place] [address] [phone]  Crisis Mobile: [facility]:  [phone] (for crisis response 24 hours a day) [facility] Hotline:      [phone] Outpatient Psychiatry and Counseling  [facility]: Mobile Crisis Management 24 hours:  [phone]  [facility] of the [employer] sliding scale fee and walk in schedule: M-F 8am-12pm/1pm-3pm [address]  [place], [place] [address] [phone]  [facility] [address] [place], [place] [address] [phone]  [facility] ([facility] [employer])- new patient walk-in appointments available Monday - Friday 8am -3pm.          [address] [place], [place] [address] [phone] or crisis line- [phone]  [facility]/ Intensive Outpatient Therapy Program [address] [place], [place] [address] [phone]  [facility]                  Crisis Services      [phone]     [place], [place] [address]                 [facility]   [facility] [phone]. [address] [place], [place] [address]   [facility] Care          [address]  [place], [place] [address]       [phone]  [facility] [address] [place], [place] [address] [phone]  [facility]    [address]    [place], [place] [address]     [phone]       Andee Poles, MD     [address]     [place] [address]     [phone]       [facility] [address] [place] [address]  [facility]     [address] [place], [place]      [phone]       [facility] Eulogio Ditch, MD [address] [place], [place] [address] [phone]  [facility]     [address]     [place], [place] [address]     [phone]       [facility] [address] [place], [place] [address] [phone] Resources for Temporary Residential Assistance/Crisis Centers  [facility]ENT[facility]) M-F 8am-3pm   [address] [place], [place] [address]   [phone] Services include: laundry, barbering, support groups, case management, phone  & computer access, showers, AA/NA mtgs, mental health/substance abuse nurse, job skills class, disability information, VA assistance, spiritual classes, etc.   HOMELESS SHELTERS  [facility]     [facility]   [address] [place]     [phone]              [employer] (women and children)       [address] [place], [place] [address]  [phone] [email or web address] for application and process Application Required  [facility]   [address]    [place] [address]     [phone]                    [place], [place] [address] [phone]([id number] application appt.) Application Required  [facility] (women only)    [address]     [place], [place] [address]     [phone]      Intake starts 6pm daily Need valid ID, SSC, & Police report [facility] [address] [place], [place] [phone] Application Required  [employer] (men only)     [facility]      [place], [place]       [place] (Pregnant women only) [address] [place],  [place]  [facility]      [place] [address]      [place], [place] [address]     [phone]             [facility] [address] [place], [place] 90 day commitment/SA/Application process  [facility](men only)     [address]     [place], [place]       Check-in at [facility] [address] [place], [place] [address] [phone] Men/Women/Women and Children must be there by 7 pm  [place], [place]

## 2015-09-23 ENCOUNTER — Encounter: Payer: Self-pay | Admitting: Emergency Medicine

## 2015-09-23 ENCOUNTER — Emergency Department
Admission: EM | Admit: 2015-09-23 | Discharge: 2015-09-23 | Disposition: A | Discharge status: Home / Self Care | Payer: Self-pay | Attending: Emergency Medicine | Admitting: Emergency Medicine

## 2015-09-23 DIAGNOSIS — F1110 Opioid abuse, uncomplicated: Secondary | ICD-10-CM | POA: Insufficient documentation

## 2015-09-23 DIAGNOSIS — Y9389 Activity, other specified: Secondary | ICD-10-CM | POA: Insufficient documentation

## 2015-09-23 DIAGNOSIS — T401X1A Poisoning by heroin, accidental (unintentional), initial encounter: Secondary | ICD-10-CM | POA: Insufficient documentation

## 2015-09-23 DIAGNOSIS — Y92481 Parking lot as the place of occurrence of the external cause: Secondary | ICD-10-CM | POA: Insufficient documentation

## 2015-09-23 DIAGNOSIS — F17210 Nicotine dependence, cigarettes, uncomplicated: Secondary | ICD-10-CM | POA: Insufficient documentation

## 2015-09-23 DIAGNOSIS — T402X1A Poisoning by other opioids, accidental (unintentional), initial encounter: Secondary | ICD-10-CM

## 2015-09-23 DIAGNOSIS — Y998 Other external cause status: Secondary | ICD-10-CM | POA: Insufficient documentation

## 2015-09-23 MED ORDER — MORPHINE SULFATE (PF) 4 MG/ML IV SOLN
INTRAVENOUS | Status: AC
  Filled 2015-09-23: qty 1

## 2015-09-23 MED ORDER — NALOXONE HCL 2 MG/2ML IJ SOSY
1.0000 mg | PREFILLED_SYRINGE | Freq: Once | INTRAMUSCULAR | Status: DC
  Filled 2015-09-23: qty 2

## 2015-09-23 MED ORDER — NALOXONE HCL 0.4 MG/0.4ML IJ SOAJ: 1.0000 "application " | Freq: Once | INTRAMUSCULAR | Status: DC

## 2015-09-23 NOTE — BH Assessment (Signed)
Assessment Note  TEE Cody Mercado is an 45 y.o. male. Who has presented unaccompanied to the ED for a psychological evaluation. Pt reports that he currently lives with his father who he identifies as his primary source of support.  When as why he was brought into the ED on today pt states " I just got some bad dope".  Pt reports that he was recnetly discharged from [facility] and relapsed soon after. Pt denies any previous MH treatment. Pt. denies any suicidal ideation, plan or intent. Pt. denies the presence of any auditory or visual hallucinations at this time. Patient denies any other medical complaints. Pt will be provided outpatient substance abuse resources. Pt denies any previous MH or SA treatment.   Diagnosis: Opioid Use Disorder   Past Medical History: History reviewed. No pertinent past medical history.  History reviewed. No pertinent past surgical history.  Family History: No family history on file.  Social History:  reports that he has been smoking Cigarettes.  He does not have any smokeless tobacco history on file. He reports that he drinks alcohol. He reports that he uses illicit drugs.  Additional Social History:  Substance #1 Name of Substance 1: Heroin  1 - Age of First Use: 43 1 - Amount (size/oz): 1 gram  1 - Frequency: Daily  1 - Duration: one year  1 - Last Use / Amount: Today   CIWA: CIWA-Ar BP: 107/63 mmHg Pulse Rate: (!) 50 COWS:    Allergies:  Allergies  Allergen Reactions  . Tramadol Nausea And Vomiting    Home Medications:  (Not in a hospital admission)  OB/GYN Status:  No LMP for male patient.  General Assessment Data Location of Assessment: [facility] ED TTS Assessment: In system Is this a Tele or Face-to-Face Assessment?: Face-to-Face Is this an Initial Assessment or a Re-assessment for this encounter?: Initial Assessment Marital status: Single Living Arrangements: Parent Can pt return to current living arrangement?: Yes Admission Status: Voluntary Is  patient capable of signing voluntary admission?: Yes Referral Source: Other (EMS) Insurance type: None   Medical Screening Exam [facility] Walk-in ONLY) Medical Exam completed: Yes  Crisis Care Plan Living Arrangements: Parent Legal Guardian: Other: (None ) Name of Psychiatrist: None reported Name of Therapist: None reported   Education Status Is patient currently in school?: No Current Grade: N/A Highest grade of school patient has completed: H.S.  Name of school: N/A Contact person: N/A  Risk to self with the past 6 months Suicidal Ideation: No Has patient been a risk to self within the past 6 months prior to admission? : No Suicidal Intent: No Has patient had any suicidal intent within the past 6 months prior to admission? : No Is patient at risk for suicide?: No Suicidal Plan?: No Has patient had any suicidal plan within the past 6 months prior to admission? : No Access to Means: No What has been your use of drugs/alcohol within the last 12 months?: Heroin  Previous Attempts/Gestures: No How many times?: 0 Triggers for Past Attempts: Other (Comment) (N/A) Intentional Self Injurious Behavior: None Family Suicide History: No Recent stressful life event(s): Other (Comment) (N/A) Persecutory voices/beliefs?: No Depression: Yes Depression Symptoms: Isolating, Loss of interest in usual pleasures, Feeling angry/irritable Substance abuse history and/or treatment for substance abuse?: Yes Suicide prevention information given to non-admitted patients: Not applicable  Risk to Others within the past 6 months Homicidal Ideation: No Does patient have any lifetime risk of violence toward others beyond the six months prior to admission? :  No Thoughts of Harm to Others: No Current Homicidal Intent: No Current Homicidal Plan: No Access to Homicidal Means: No Identified Victim: N/A History of harm to others?: No Assessment of Violence: None Noted Violent Behavior Description:  (None  ) Does patient have access to weapons?: No Criminal Charges Pending?: Yes Describe Pending Criminal Charges: DUI and an unknown charge  Does patient have a court date: Yes (March) Court Date: 11/02/15 Is patient on probation?: No  Psychosis Hallucinations: None noted Delusions: None noted  Mental Status Report Appearance/Hygiene: Disheveled Eye Contact: Poor Motor Activity: Freedom of movement Speech: Slow, Slurred Level of Consciousness: Drowsy, Sedated Mood: Irritable Affect: Blunted, Flat Anxiety Level: None Thought Processes: Relevant Judgement: Partial Orientation: Time, Place, Person, Situation Obsessive Compulsive Thoughts/Behaviors: None  Cognitive Functioning Concentration: Poor Memory: Remote Intact, Recent Intact IQ: Average Insight: Poor Impulse Control: Poor Appetite: Fair Weight Loss: 0 Weight Gain: 0 Sleep: No Change Total Hours of Sleep: 5 Vegetative Symptoms: None  ADLScreening [facility] Assessment Services) Patient's cognitive ability adequate to safely complete daily activities?: Yes Patient able to express need for assistance with ADLs?: Yes Independently performs ADLs?: Yes (appropriate for developmental age)  Prior Inpatient Therapy Prior Inpatient Therapy: No Prior Therapy Dates: N/A Prior Therapy Facilty/Provider(s): None Reason for Treatment: None  Prior Outpatient Therapy Prior Outpatient Therapy: No Prior Therapy Dates: None Prior Therapy Facilty/Provider(s): None Reason for Treatment: None Does patient have an ACCT team?: No Does patient have Intensive In-House Services?  : No Does patient have [facility] services? : No Does patient have P4CC services?: No  ADL Screening (condition at time of admission) Patient's cognitive ability adequate to safely complete daily activities?: Yes Patient able to express need for assistance with ADLs?: Yes Independently performs ADLs?: Yes (appropriate for developmental age)       Abuse/Neglect  Assessment (Assessment to be complete while patient is alone) Physical Abuse: Denies Verbal Abuse: Denies Sexual Abuse: Denies Exploitation of patient/patient's resources: Denies Self-Neglect: Denies Values / Beliefs Cultural Requests During Hospitalization: None Spiritual Requests During Hospitalization: None Consults Spiritual Care Consult Needed: No Social Work Consult Needed: No      Additional Information 1:1 In Past 12 Months?: No CIRT Risk: No Elopement Risk: No Does patient have medical clearance?: Yes     Disposition:  Disposition Initial Assessment Completed for this Encounter: Yes Disposition of Patient: Outpatient treatment  On Site Evaluation by:   Reviewed with Physician:    Asa Saunas 09/23/2015 2:57 PM

## 2015-09-23 NOTE — ED Notes (Signed)
Pt to ed via ems from [facility] after overdosing on heroin.  Pt alert and oriented,  Skin warm and dry.  Pt in nad.  Pt does appear drowsy but awake to voice only and is appropriate in responses.  Pt placed on cm,  Iv in place per ems.  Monitoring pt closely at this time.

## 2015-09-23 NOTE — ED Notes (Signed)
Police at bedside ..

## 2015-09-23 NOTE — ED Notes (Signed)
Forensic blood draw done.  Pt tolerated well.

## 2015-09-23 NOTE — ED Provider Notes (Signed)
[facility] Emergency Department Provider Note  ____________________________________________  Time seen: Approximately 11:49 AM  I have reviewed the triage vital signs and the nursing notes.   HISTORY  Chief Complaint Drug Overdose    HPI Cody Mercado is a 45 y.o. male presents by EMS.  EMS reports that they found patient in a family member both passed on the vehicle. Patient reported to EMS and myself that he was snorting heroin. He unintentionally overdosed. Appears his family member also unintentionally overdosed.  After receiving naloxone the patient became fully awake and alert. He reports that he's been a long-time heroin user for well over a year, he occasionally uses IV and occasionally snorts. He started today and recalls becoming very sleepy. He then awoke under the care of EMS. He reports some mild nausea but no other concerns. He denies intentional overdose, suicidal ideation or other concerns.  He denies any pain, chest pain, trouble breathing or other concerns at this time. He reports that the only substance use using today was heroin, though he is aware of a possible increase in "fentanyl" being cut with it.  EMS reports the patient's symptoms reversed with 1 mg naloxone. He did have shallow respirations and pinpoint pupils prior to administration.   History reviewed. No pertinent past medical history.  There are no active problems to display for this patient.   History reviewed. No pertinent past surgical history.  Current Outpatient Rx  Name  Route  Sig  Dispense  Refill  . Naloxone HCl 0.4 MG/0.4ML SOAJ   Injection   Inject 1 application as directed once. Dispense 1 naloxone autoinjector kit   1 Package   0     Allergies Tramadol  No family history on file.  Social History Social History  Substance Use Topics  . Smoking status: Current Every Day Smoker    Types: Cigarettes  . Smokeless tobacco: None  . Alcohol Use: Yes      Comment: occasionally    Review of Systems Constitutional: No fever/chills Eyes: No visual changes. ENT: No sore throat. Cardiovascular: Denies chest pain. Respiratory: Denies shortness of breath. Gastrointestinal: No abdominal pain.  No vomiting.  No diarrhea.  No constipation. Genitourinary: Negative for dysuria. Musculoskeletal: Negative for back pain. Skin: Negative for rash. Neurological: Negative for headaches, focal weakness or numbness. Denies use of any fentanyl patches or other narcotics or prescription medical. 10-point ROS otherwise negative.  ____________________________________________   PHYSICAL EXAM:  VITAL SIGNS: ED Triage Vitals  Enc Vitals Group     BP --      Pulse --      Resp --      Temp --      Temp src --      SpO2 --      Weight --      Height --      Head Cir --      Peak Flow --      Pain Score --      Pain Loc --      Pain Edu? --      Excl. in [place]? --    Constitutional: Alert and oriented though just slightly somnolent. Well appearing and in no acute distress. Able to sit up, interactive and have full conversation. Eyes: Conjunctivae are normal. PERRL. EOMI. Head: Atraumatic. Nose: No congestion/rhinnorhea. Mouth/Throat: Mucous membranes are moist.  Oropharynx non-erythematous. Neck: No stridor.   Cardiovascular: Normal rate, regular rhythm. Grossly normal heart sounds.  Good peripheral  circulation. Respiratory: Normal respiratory effort.  No retractions. Lungs CTAB. Gastrointestinal: Soft and nontender. Musculoskeletal: No lower extremity tenderness nor edema.  No joint effusions. Neurologic:  Normal speech and language. No gross focal neurologic deficits are appreciated. Skin:  Skin is warm, dry and intact. No rash noted. Psychiatric: Mood and affect are normal. Speech and behavior are normal.  ____________________________________________   LABS (all labs ordered are listed, but only abnormal results are displayed)  Labs  Reviewed  URINE DRUG SCREEN, QUALITATIVE ([place])  CBG MONITORING, ED   ____________________________________________  EKG  ED ECG REPORT I, Juna Caban, the attending physician, personally viewed and interpreted this ECG.  Date: 09/23/2015 EKG Time: 1155 Rate: 60 Rhythm: normal sinus rhythm QRS Axis: normal Intervals: normal ST/T Wave abnormalities: normal Conduction Disturbances: none Narrative Interpretation: unremarkable  ____________________________________________  RADIOLOGY   ____________________________________________   PROCEDURES  Procedure(s) performed: None  Critical Care performed: No  ____________________________________________   INITIAL IMPRESSION / ASSESSMENT AND PLAN / ED COURSE  Pertinent labs & imaging results that were available during my care of the patient were reviewed by me and considered in my medical decision making (see chart for details).  Patient presents after reporting heroin use, presentation consistent with unintentional overdose. Denies any suicidal ideation, and reports that he was using recreationally along with his family member. Both required reversal. Classical symptoms for opioid. He denies any other coingestants, no methadone, hydrocodone, oxycodone, or other opioids. States he is purely using heroin today.  I will observe the patient closely for recent sedation. If he is showing no evidence of recent sedation plan to discharge patient to home and 2 hours. He absolutely denies this was an intentional overdose and denies any other coingestants. Awake and alert in no distress.  ----------------------------------------- 2:45 PM on 09/23/2015 -----------------------------------------  Patient is awake alert and taking fluids in no distress. Denies any thoughts of harming himself. Discussed with the patient the dangers of using drugs such as heroin, and he is agreeable to discontinue use and follow up with detox program.  I  will prescribe an discussed prescription of naloxone injection for receiving overdose with the patient and he is agreeable to obtaining this.  Patient is sitting up, alert, ice chips. Reports he has a safe ride home. Understands the risks of opioid/narcotic/heroin use including the risks of death should he overuse.  Return precautions and treatment recommendations and follow-up discussed with the patient who is agreeable with the plan.  ____________________________________________   FINAL CLINICAL IMPRESSION(S) / ED DIAGNOSES  Final diagnoses:  Heroin abuse  Opioid overdose, accidental or unintentional, initial encounter      Delman Kitten, MD 09/23/15 1446

## 2015-09-23 NOTE — ED Notes (Signed)
Pt to ed via ems from [employer] [facility] parking lot after overdosing on heroin. Pt alert and oriented on arrival to ed,  Dr Fanny Bien at bedside.

## 2015-09-23 NOTE — Discharge Instructions (Signed)
Please return to the ED immediately if you have ANY thoughts of hurting yourself or anyone else, so that we may help you.  Please avoid alcohol and drug use. Heroin and illegal drug use can be deadly.  Follow up with your doctor and/or therapist as soon as possible regarding today's ED visit.     Accidental Overdose A drug overdose occurs when a chemical substance (drug or medication) is used in amounts large enough to overcome a person. This may result in severe illness or death. This is a type of poisoning. Accidental overdoses of medications or other substances come from a variety of reasons. When this happens accidentally, it is often because the person taking the substance does not know enough about what they have taken. Drugs which commonly cause overdose deaths are alcohol, psychotropic medications (medications which affect the mind), pain medications, illegal drugs (street drugs) such as cocaine and heroin, and multiple drugs taken at the same time. It may result from careless behavior (such as over-indulging at a party). Other causes of overdose may include multiple drug use, a lapse in memory, or drug use after a period of no drug use.  Sometimes overdosing occurs because a person cannot remember if they have taken their medication.  A common unintentional overdose in young children involves multi-vitamins containing iron. Iron is a part of the hemoglobin molecule in blood. It is used to transport oxygen to living cells. When taken in small amounts, iron allows the body to restock hemoglobin. In large amounts, it causes problems in the body. If this overdose is not treated, it can lead to death. Never take medicines that show signs of tampering or do not seem quite right. Never take medicines in the dark or in poor lighting. Read the label and check each dose of medicine before you take it. When adults are poisoned, it happens most often through carelessness or lack of information. Taking  medicines in the dark or taking medicine prescribed for someone else to treat the same type of problem is a dangerous practice. SYMPTOMS  Symptoms of overdose depend on the medication and amount taken. They can vary from over-activity with stimulant over-dosage, to sleepiness from depressants such as alcohol, narcotics and tranquilizers. Confusion, dizziness, nausea and vomiting may be present. If problems are severe enough coma and death may result. DIAGNOSIS  Diagnosis and management are generally straightforward if the drug is known. Otherwise it is more difficult. At times, certain symptoms and signs exhibited by the patient, or blood tests, can reveal the drug in question.  TREATMENT  In an emergency department, most patients can be treated with supportive measures. Antidotes may be available if there has been an overdose of opioids or benzodiazepines. A rapid improvement will often occur if this is the cause of overdose. At home or away from medical care:  There may be no immediate problems or warning signs in children.  Not everything works well in all cases of poisoning.  Take immediate action. Poisons may act quickly.  If you think someone has swallowed medicine or a household product, and the person is unconscious, having seizures (convulsions), or is not breathing, immediately call for an ambulance. IF a person is conscious and appears to be doing OK but has swallowed a poison:  Do not wait to see what effect the poison will have. Immediately call a poison control center (listed in the white pages of your telephone book under "Poison Control" or inside the front cover with other emergency numbers).  Some poison control centers have TTY capability for the deaf. Check with your local center if you or someone in your family requires this service.  Keep the container so you can read the label on the product for ingredients.  Describe what, when, and how much was taken and the age and  condition of the person poisoned. Inform them if the person is vomiting, choking, drowsy, shows a change in color or temperature of skin, is conscious or unconscious, or is convulsing.  Do not cause vomiting unless instructed by medical personnel. Do not induce vomiting or force liquids into a person who is convulsing, unconscious, or very drowsy. Stay calm and in control.   Activated charcoal also is sometimes used in certain types of poisoning and you may wish to add a supply to your emergency medicines. It is available without a prescription. Call a poison control center before using this medication. PREVENTION  Thousands of children die every year from unintentional poisoning. This may be from household chemicals, poisoning from carbon monoxide in a car, taking their parent's medications, or simply taking a few iron pills or vitamins with iron. Poisoning comes from unexpected sources.  Store medicines out of the sight and reach of children, preferably in a locked cabinet. Do not keep medications in a food cabinet. Always store your medicines in a secure place. Get rid of expired medications.  If you have children living with you or have them as occasional guests, you should have child-resistant caps on your medicine containers. Keep everything out of reach. Child proof your home.  If you are called to the telephone or to answer the door while you are taking a medicine, take the container with you or put the medicine out of the reach of small children.  Do not take your medication in front of children. Do not tell your child how good a medication is and how good it is for them. They may get the idea it is more of a treat.  If you are an adult and have accidentally taken an overdose, you need to consider how this happened and what can be done to prevent it from happening again. If this was from a street drug or alcohol, determine if there is a problem that needs addressing. If you are not sure a  problems exists, it is easy to talk to a professional and ask them if they think you have a problem. It is better to handle this problem in this way before it happens again and has a much worse consequence.   This information is not intended to replace advice given to you by your health care provider. Make sure you discuss any questions you have with your health care provider.   Document Released: 10/13/2004 Document Revised: 08/20/2014 Document Reviewed: 01/17/2015 Elsevier Interactive Patient Education 2016 [employer].  Narcotic Overdose A narcotic overdose is the misuse or overuse of a narcotic drug. A narcotic overdose can make you pass out and stop breathing. If you are not treated right away, this can cause permanent brain damage or stop your heart. Medicine may be given to reverse the effects of an overdose. If so, this medicine may bring on withdrawal symptoms. The symptoms may be abdominal cramps, throwing up (vomiting), sweating, chills, and nervousness. Injecting narcotics can cause more problems than just an overdose. AIDS, hepatitis, and other very serious infections are transmitted by sharing needles and syringes. If you decide to quit using, there are medicines which can help you through  the withdrawal period. Trying to quit all at once on your own can be uncomfortable, but not life-threatening. Call your caregiver, Narcotics Anonymous, or any drug and alcohol treatment program for further help.    This information is not intended to replace advice given to you by your health care provider. Make sure you discuss any questions you have with your health care provider.   Document Released: 09/06/2004 Document Revised: 08/20/2014 Document Reviewed: 01/19/2015 Elsevier Interactive Patient Education [employer]

## 2017-09-19 ENCOUNTER — Emergency Department
Admission: EM | Admit: 2017-09-19 | Discharge: 2017-09-19 | Disposition: A | Discharge status: Home / Self Care | Payer: Self-pay | Attending: Student in an Organized Health Care Education/Training Program | Admitting: Student in an Organized Health Care Education/Training Program

## 2017-09-19 ENCOUNTER — Emergency Department: Payer: Self-pay

## 2017-09-19 ENCOUNTER — Encounter: Payer: Self-pay | Admitting: Emergency Medicine

## 2017-09-19 DIAGNOSIS — Y999 Unspecified external cause status: Secondary | ICD-10-CM | POA: Insufficient documentation

## 2017-09-19 DIAGNOSIS — Y929 Unspecified place or not applicable: Secondary | ICD-10-CM | POA: Insufficient documentation

## 2017-09-19 DIAGNOSIS — S0083XA Contusion of other part of head, initial encounter: Secondary | ICD-10-CM | POA: Insufficient documentation

## 2017-09-19 DIAGNOSIS — Y939 Activity, unspecified: Secondary | ICD-10-CM | POA: Insufficient documentation

## 2017-09-19 DIAGNOSIS — S0990XA Unspecified injury of head, initial encounter: Secondary | ICD-10-CM

## 2017-09-19 DIAGNOSIS — F17210 Nicotine dependence, cigarettes, uncomplicated: Secondary | ICD-10-CM | POA: Insufficient documentation

## 2017-09-19 MED ORDER — BACITRACIN ZINC 500 UNIT/GM EX OINT
TOPICAL_OINTMENT | Freq: Once | CUTANEOUS | Status: AC
  Administered 2017-09-19: 1 via TOPICAL
  Filled 2017-09-19: qty 0.9

## 2017-09-19 MED ORDER — BUTALBITAL-APAP-CAFFEINE 50-325-40 MG PO TABS
1.0000 | ORAL_TABLET | ORAL | Status: DC | PRN
  Administered 2017-09-19: 1 via ORAL
  Filled 2017-09-19: qty 1

## 2017-09-19 MED ORDER — SODIUM CHLORIDE 0.9 % IV BOLUS (SEPSIS): 1000.0000 mL | Freq: Once | INTRAVENOUS | Status: DC

## 2017-09-19 NOTE — ED Notes (Signed)
Pt given graham crackers and ginger ale, ok by Dr. Roxan Hockeyobinson

## 2017-09-19 NOTE — ED Notes (Signed)
ED Provider at bedside. 
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
## 2017-09-19 NOTE — ED Notes (Signed)
Pt left before signing DC paperwork, instructions were reviewed by MD.
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
## 2017-09-19 NOTE — ED Notes (Signed)
Pt states he "does not want fluids and want to be discharged." EDP aware.

## 2017-09-19 NOTE — ED Notes (Signed)
Pt call light going on and pt is requested pain medicine, Rn kasey notified
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
## 2019-12-08 ENCOUNTER — Other Ambulatory Visit: Payer: Self-pay

## 2019-12-08 ENCOUNTER — Encounter: Payer: Self-pay | Admitting: Emergency Medicine

## 2019-12-08 ENCOUNTER — Emergency Department: Payer: Self-pay

## 2019-12-08 ENCOUNTER — Emergency Department
Admission: EM | Admit: 2019-12-08 | Discharge: 2019-12-08 | Disposition: A | Discharge status: Home / Self Care | Payer: Self-pay | Attending: Student | Admitting: Student

## 2019-12-08 DIAGNOSIS — Y288XXA Contact with other sharp object, undetermined intent, initial encounter: Secondary | ICD-10-CM | POA: Insufficient documentation

## 2019-12-08 DIAGNOSIS — S61412A Laceration without foreign body of left hand, initial encounter: Secondary | ICD-10-CM | POA: Insufficient documentation

## 2019-12-08 DIAGNOSIS — Y929 Unspecified place or not applicable: Secondary | ICD-10-CM | POA: Insufficient documentation

## 2019-12-08 DIAGNOSIS — Y990 Civilian activity done for income or pay: Secondary | ICD-10-CM | POA: Insufficient documentation

## 2019-12-08 DIAGNOSIS — F17210 Nicotine dependence, cigarettes, uncomplicated: Secondary | ICD-10-CM | POA: Insufficient documentation

## 2019-12-08 DIAGNOSIS — Y939 Activity, unspecified: Secondary | ICD-10-CM | POA: Insufficient documentation

## 2019-12-08 DIAGNOSIS — Z23 Encounter for immunization: Secondary | ICD-10-CM | POA: Insufficient documentation

## 2019-12-08 MED ORDER — CEPHALEXIN 500 MG PO CAPS
500.0000 mg | ORAL_CAPSULE | Freq: Once | ORAL | Status: AC
  Administered 2019-12-08: 500 mg via ORAL

## 2019-12-08 MED ORDER — OXYCODONE-ACETAMINOPHEN 5-325 MG PO TABS
1.0000 | ORAL_TABLET | Freq: Once | ORAL | Status: AC
  Administered 2019-12-08: 14:00:00 1 via ORAL
  Filled 2019-12-08: qty 1

## 2019-12-08 MED ORDER — TETANUS-DIPHTH-ACELL PERTUSSIS 5-2.5-18.5 LF-MCG/0.5 IM SUSP
0.5000 mL | Freq: Once | INTRAMUSCULAR | Status: AC
  Administered 2019-12-08: 14:00:00 0.5 mL via INTRAMUSCULAR
  Filled 2019-12-08: qty 0.5

## 2019-12-08 MED ORDER — LIDOCAINE-EPINEPHRINE (PF) 2 %-1:200000 IJ SOLN
10.0000 mL | Freq: Once | INTRAMUSCULAR | Status: AC
  Administered 2019-12-08: 10 mL
  Filled 2019-12-08: qty 10

## 2019-12-08 MED ORDER — LIDOCAINE-EPINEPHRINE-TETRACAINE (LET) TOPICAL GEL
3.0000 mL | Freq: Once | TOPICAL | Status: AC
  Administered 2019-12-08: 3 mL via TOPICAL
  Filled 2019-12-08: qty 3

## 2019-12-08 MED ORDER — CEPHALEXIN 500 MG PO CAPS: 500.0000 mg | ORAL_CAPSULE | Freq: Four times a day (QID) | ORAL | 0 refills | Status: AC

## 2019-12-08 MED ORDER — LIDOCAINE-EPINEPHRINE (PF) 2 %-1:200000 IJ SOLN
10.0000 mL | Freq: Once | INTRAMUSCULAR | Status: AC
  Administered 2019-12-08: 10 mL via INTRADERMAL
  Filled 2019-12-08: qty 10

## 2019-12-08 MED ORDER — OXYCODONE-ACETAMINOPHEN 5-325 MG PO TABS: 1.0000 | ORAL_TABLET | Freq: Three times a day (TID) | ORAL | 0 refills | Status: AC | PRN

## 2019-12-08 NOTE — ED Provider Notes (Signed)
[facility] Emergency Department Provider Note  ____________________________________________  Time seen: Approximately 2:16 PM  I have reviewed the triage vital signs and the nursing notes.   HISTORY  Chief Complaint Laceration    HPI Cody Mercado is a 49 y.o. male that presents to the emergency department for evaluation of laceration to left hand.  Patient was using a razor at work when it slipped and he cut his left hand.  He is able to move his thumb but it is painful.  He is unsure of last tetanus.  No additional injuries.   History reviewed. No pertinent past medical history.  There are no problems to display for this patient.   History reviewed. No pertinent surgical history.  Prior to Admission medications   Medication Sig Start Date End Date Taking? Authorizing Provider  cephALEXin (KEFLEX) 500 MG capsule Take 1 capsule (500 mg total) by mouth 4 (four) times daily for 10 days. 12/08/19 12/18/19  Laban Emperor, PA-C  oxyCODONE-acetaminophen (PERCOCET) 5-325 MG tablet Take 1 tablet by mouth every 8 (eight) hours as needed for up to 3 days for severe pain. 12/08/19 12/11/19  Laban Emperor, PA-C    Allergies Tramadol  No family history on file.  Social History Social History   Tobacco Use  . Smoking status: Current Every Day Smoker    Types: Cigarettes  . Smokeless tobacco: Never Used  Substance Use Topics  . Alcohol use: Yes    Comment: occasionally  . Drug use: Yes    Comment: heroin, fentanyl, oxycodone     Review of Systems  Gastrointestinal: No nausea, no vomiting.  Musculoskeletal: Positive for hand pain. Skin: Negative for rash, abrasions, ecchymosis.  Positive for laceration. Neurological: Negative for numbness or tingling   ____________________________________________   PHYSICAL EXAM:  VITAL SIGNS: ED Triage Vitals  Enc Vitals Group     BP 12/08/19 1313 140/78     Pulse Rate 12/08/19 1313 88     Resp 12/08/19 1313  18     Temp 12/08/19 1313 98 F (36.7 C)     Temp Source 12/08/19 1313 Oral     SpO2 12/08/19 1313 98 %     Weight 12/08/19 1255 182 lb (82.6 kg)     Height 12/08/19 1255 6\' 3"  (1.905 m)     Head Circumference --      Peak Flow --      Pain Score 12/08/19 1255 10     Pain Loc --      Pain Edu? --      Excl. in [place]? --      Constitutional: Alert and oriented. Well appearing and in no acute distress. Eyes: Conjunctivae are normal. PERRL. EOMI. Head: Atraumatic. ENT:      Ears:      Nose: No congestion/rhinnorhea.      Mouth/Throat: Mucous membranes are moist.  Neck: No stridor. Cardiovascular: Normal rate, regular rhythm.  Good peripheral circulation.  Symmetric radial pulses bilaterally. Respiratory: Normal respiratory effort without tachypnea or retractions. Lungs CTAB. Good air entry to the bases with no decreased or absent breath sounds. Musculoskeletal: Full range of motion to all extremities. No gross deformities appreciated.  Limited range of motion of left wrist.  Tendon appears intact.  Able to flex and extend thumb.  Difficulty performing finger opposition with thumb and little finger. Neurologic:  Normal speech and language. No gross focal neurologic deficits are appreciated.  Skin:  Skin is warm, dry and intact.  4 cm  laceration to dorsal palm extending from proximal thumb to wrist with active bleeding. Psychiatric: Mood and affect are normal. Speech and behavior are normal. Patient exhibits appropriate insight and judgement.   ____________________________________________   LABS (all labs ordered are listed, but only abnormal results are displayed)  Labs Reviewed - No data to display ____________________________________________  EKG   ____________________________________________  RADIOLOGY Lexine Baton, personally viewed and evaluated these images (plain radiographs) as part of my medical decision making, as well as reviewing the written report by the  radiologist.  DG Hand Complete Left  Result Date: 12/08/2019 CLINICAL DATA:  Laceration with razor EXAM: LEFT HAND - COMPLETE 3+ VIEW COMPARISON:  None. FINDINGS: Frontal, oblique, and lateral views obtained. Soft tissue injury is noted laterally with overlying bandage. Beyond the bandage, there is no other radiopaque foreign body. There is no appreciable fracture or dislocation. Joint spaces appear normal. No erosive change. IMPRESSION: Soft tissue injury laterally with overlying bandage. No fracture or dislocation. No evident arthropathy. Electronically Signed   By: Bretta Bang III M.D.   On: 12/08/2019 14:12    ____________________________________________    PROCEDURES  Procedure(s) performed:    Procedures  LACERATION REPAIR Performed by: Enid Derry  Consent: Verbal consent obtained.  Consent given by: patient  Prepped and Draped in normal sterile fashion  Wound explored: No foreign bodies   Laceration Location: hand  Laceration Length: 4 cm  Anesthesia: None  Local anesthetic: lidocaine 1% with epinephrine  Anesthetic total: 15 ml  Irrigation method: syringe  Amount of cleaning: normal saline  Skin closure: 4-0 nylon  Number of sutures: 10-15  Technique: Simple interrupted  Patient tolerance: Patient tolerated the procedure well with no immediate complications.  Medications  lidocaine-EPINEPHrine (XYLOCAINE W/EPI) 2 %-1:200000 (PF) injection 10 mL (10 mLs Intradermal Given 12/08/19 1354)  Tdap (BOOSTRIX) injection 0.5 mL (0.5 mLs Intramuscular Given 12/08/19 1354)  oxyCODONE-acetaminophen (PERCOCET/ROXICET) 5-325 MG per tablet 1 tablet (1 tablet Oral Given 12/08/19 1354)  lidocaine-EPINEPHrine-tetracaine (LET) topical gel (3 mLs Topical Given by Other 12/08/19 1609)  lidocaine-EPINEPHrine (XYLOCAINE W/EPI) 2 %-1:200000 (PF) injection 10 mL (10 mLs Infiltration Given by Other 12/08/19 1608)  cephALEXin (KEFLEX) capsule 500 mg (500 mg Oral Given  12/08/19 1609)     ____________________________________________   INITIAL IMPRESSION / ASSESSMENT AND PLAN / ED COURSE  Pertinent labs & imaging results that were available during my care of the patient were reviewed by me and considered in my medical decision making (see chart for details).  Review of the [place] CSRS was performed in accordance of the [facility] prior to dispensing any controlled drugs.    Patient presented to emergency department for evaluation of hand laceration.  Vital signs and exam are reassuring.  Patient has a large laceration to his left hand.  Tendon appears intact.  He did seem to cut into some of the muscle.  Patient is able to move his thumb but he is having difficulty performing finger opposition with his thumb.  Bleeding was controlled with lidocaine with epinephrine and pressure.    Laceration was repaired with stitches.  Wound was dressed.  Thumb spica splint was applied.  Tetanus shot was updated.  Patient will be discharged home with prescriptions for Keflex and a short course of Percocet. Patient is to follow up with hand orthopedics as directed.  Referral was given to Dr. Stephenie Acres.  Patient is given ED precautions to return to the ED for any worsening or new symptoms.  Reola Mosher  was evaluated in Emergency Department on 12/08/2019 for the symptoms described in the history of present illness. He was evaluated in the context of the global COVID-19 pandemic, which necessitated consideration that the patient might be at risk for infection with the SARS-CoV-2 virus that causes COVID-19. Institutional protocols and algorithms that pertain to the evaluation of patients at risk for COVID-19 are in a state of rapid change based on information released by regulatory bodies including the CDC and federal and state organizations. These policies and algorithms were followed during the patient's care in the ED.   ____________________________________________  FINAL CLINICAL  IMPRESSION(S) / ED DIAGNOSES  Final diagnoses:  Laceration of left hand, foreign body presence unspecified, initial encounter      NEW MEDICATIONS STARTED DURING THIS VISIT:  ED Discharge Orders         Ordered    cephALEXin (KEFLEX) 500 MG capsule  4 times daily     12/08/19 1559    oxyCODONE-acetaminophen (PERCOCET) 5-325 MG tablet  Every 8 hours PRN     12/08/19 1601              This chart was dictated using voice recognition software/Dragon. Despite best efforts to proofread, errors can occur which can change the meaning. Any change was purely unintentional.    Enid Derry, PA-C 12/08/19 1617    Miguel Aschoff., MD 12/08/19 [phone]

## 2019-12-08 NOTE — Discharge Instructions (Addendum)
Please keep wound clean and dry.  Keep wound covered.  Please wear splint.  I do not think that you injured the tendon in your thumb but you did cut some of the muscle.  Please follow up with hand orthopedics this week so they can evaluate your injury. Take keflex to prevent infection. Do not work or drive while taking pain medication.

## 2019-12-08 NOTE — ED Triage Notes (Signed)
Has laceration left hand below thumb.  Cut with a razor when he was cutting some wire.

## 2019-12-08 NOTE — ED Notes (Signed)
See triage note  Presents with large laceration to left hand   States he was cutting some wire over his head   Razor slipped  Laceration noted to lateral aspect of hand

## 2019-12-15 ENCOUNTER — Emergency Department
Admission: EM | Admit: 2019-12-15 | Discharge: 2019-12-15 | Disposition: A | Discharge status: Home / Self Care | Payer: Self-pay | Attending: Emergency Medicine | Admitting: Emergency Medicine

## 2019-12-15 ENCOUNTER — Encounter: Payer: Self-pay | Admitting: Emergency Medicine

## 2019-12-15 ENCOUNTER — Other Ambulatory Visit: Payer: Self-pay

## 2019-12-15 DIAGNOSIS — S61012D Laceration without foreign body of left thumb without damage to nail, subsequent encounter: Secondary | ICD-10-CM | POA: Insufficient documentation

## 2019-12-15 DIAGNOSIS — T8130XA Disruption of wound, unspecified, initial encounter: Secondary | ICD-10-CM

## 2019-12-15 DIAGNOSIS — Z4889 Encounter for other specified surgical aftercare: Secondary | ICD-10-CM | POA: Insufficient documentation

## 2019-12-15 DIAGNOSIS — X58XXXD Exposure to other specified factors, subsequent encounter: Secondary | ICD-10-CM | POA: Insufficient documentation

## 2019-12-15 NOTE — Discharge Instructions (Signed)
Return to the emergency department in 4 days for wound evaluation also removal of the remaining sutures.  Continue to keep the area clean and dry.  Watch for any signs of infection such as pus or fever.  Leave Steri-Strips on until they fall off on their own.

## 2019-12-15 NOTE — ED Notes (Signed)
See triage note   Presents for suture removal to left thumb/hand  Was seen last week  States he cut is with a razor  Sutures intact at the ends  But center of wound is open  States this happened this am

## 2019-12-15 NOTE — ED Provider Notes (Signed)
[facility] Emergency Department Provider Note  ____________________________________________   First MD Initiated Contact with Patient 12/15/19 1007     (approximate)  I have reviewed the triage vital signs and the nursing notes.   HISTORY  Chief Complaint Suture / Staple Removal   HPI Cody Mercado is a 49 y.o. male presents to the ED with concerns that his sutures have broken.  Patient was seen in the ED on 12/08/2019 for laceration to his hand.  Patient also was given Keflex 500 mg 4 times daily for 10 days which he states he is taking.  He reports that the sutures opened up last evening.  He rates his pain a 0/10.     History reviewed. No pertinent past medical history.  There are no problems to display for this patient.   History reviewed. No pertinent surgical history.  Prior to Admission medications   Medication Sig Start Date End Date Taking? Authorizing Provider  cephALEXin (KEFLEX) 500 MG capsule Take 1 capsule (500 mg total) by mouth 4 (four) times daily for 10 days. 12/08/19 12/18/19  Enid Derry, PA-C    Allergies Tramadol  No family history on file.  Social History Social History   Tobacco Use  . Smoking status: Current Every Day Smoker    Types: Cigarettes  . Smokeless tobacco: Never Used  Substance Use Topics  . Alcohol use: Yes    Comment: occasionally  . Drug use: Yes    Comment: heroin, fentanyl, oxycodone    Review of Systems Constitutional: No fever/chills Cardiovascular: Denies chest pain. Respiratory: Denies shortness of breath. Musculoskeletal: Negative for muscle skeletal pain. Skin: Positive for laceration left hand. Neurological: Negative for  focal weakness or numbness. ___________________________________________   PHYSICAL EXAM:  VITAL SIGNS: ED Triage Vitals  Enc Vitals Group     BP 12/15/19 0946 125/73     Pulse Rate 12/15/19 0946 62     Resp 12/15/19 0946 18     Temp 12/15/19 0946 98.1 F  (36.7 C)     Temp Source 12/15/19 0946 Oral     SpO2 12/15/19 0946 100 %     Weight 12/15/19 0938 182 lb 1.6 oz (82.6 kg)     Height 12/15/19 0938 6\' 3"  (1.905 m)     Head Circumference --      Peak Flow --      Pain Score 12/15/19 0938 0     Pain Loc --      Pain Edu? --      Excl. in [place]? --     Constitutional: Alert and oriented. Well appearing and in no acute distress. Eyes: Conjunctivae are normal.  Head: Atraumatic. Neck: No stridor.   Cardiovascular: Normal rate, regular rhythm. Grossly normal heart sounds.  Good peripheral circulation. Respiratory: Normal respiratory effort.  No retractions. Lungs CTAB. Musculoskeletal: On examination of left hand patient is able move all digits without any difficulty.  On examination of the suture line to the left thumb the mid section of the laceration has open and exposed broken sutures are present.  Skin edges are turned under.  No active drainage however wound appears to be older than the time that patient states that is open. Neurologic:  Normal speech and language. No gross focal neurologic deficits are appreciated.  Skin:  Skin is warm, dry.  Open wound as noted above. Psychiatric: Mood and affect are normal. Speech and behavior are normal.  ____________________________________________   LABS (all labs ordered are listed,  but only abnormal results are displayed)  Labs Reviewed - No data to display   PROCEDURES  Procedure(s) performed (including Critical Care):  Procedures Steri-Strips applied by Halford Decamp, RN.  ____________________________________________   INITIAL IMPRESSION / ASSESSMENT AND PLAN / ED COURSE  As part of my medical decision making, I reviewed the following data within the electronic MEDICAL RECORD NUMBER Notes from prior ED visits and [place] Controlled Substance Database  49 year old male presents to the ED with open wound to his left hand.  Patient was seen in the ED on 03/08/2020 for laceration to his left  thumb.  He states that the sutures broke last evening.  Records indicate that patient has been taking Keflex 500 mg 4 times daily and has a prescription for 10 days.  He is to continue taking that medicine until completely finished.  Steri-Strips were applied.  Patient is to return in 4 days for remaining sutures to be removed and evaluation of his open wound.  ____________________________________________   FINAL CLINICAL IMPRESSION(S) / ED DIAGNOSES  Final diagnoses:  Wound dehiscence     ED Discharge Orders    None       Note:  This document was prepared using Dragon voice recognition software and may include unintentional dictation errors.    Johnn Hai, PA-C 12/15/19 1237    Vanessa [place], MD 12/16/19 2032

## 2019-12-15 NOTE — ED Triage Notes (Signed)
Arrives for suture removal

## 2021-01-23 IMAGING — DX DG HAND COMPLETE 3+V*L*
3 series · 3 of 3 positions shown · non-contrast
Comparison: None.

CLINICAL DATA: Laceration with razor

EXAM:
LEFT HAND - COMPLETE 3+ VIEW

[hand ap]
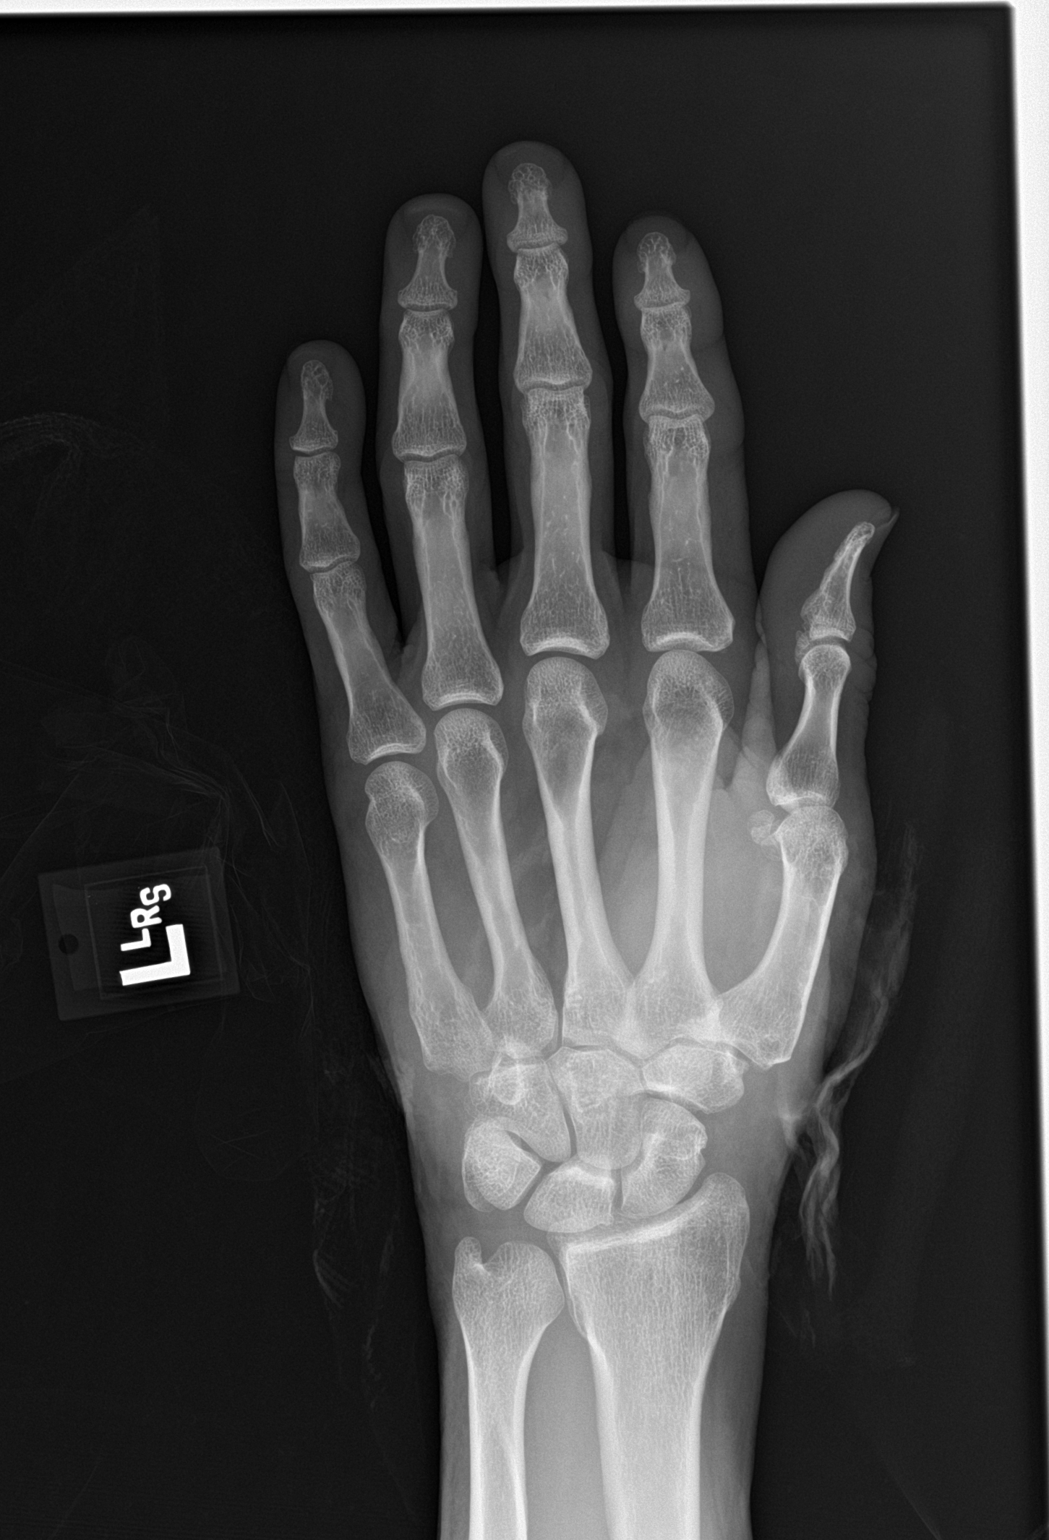

[hand obl]
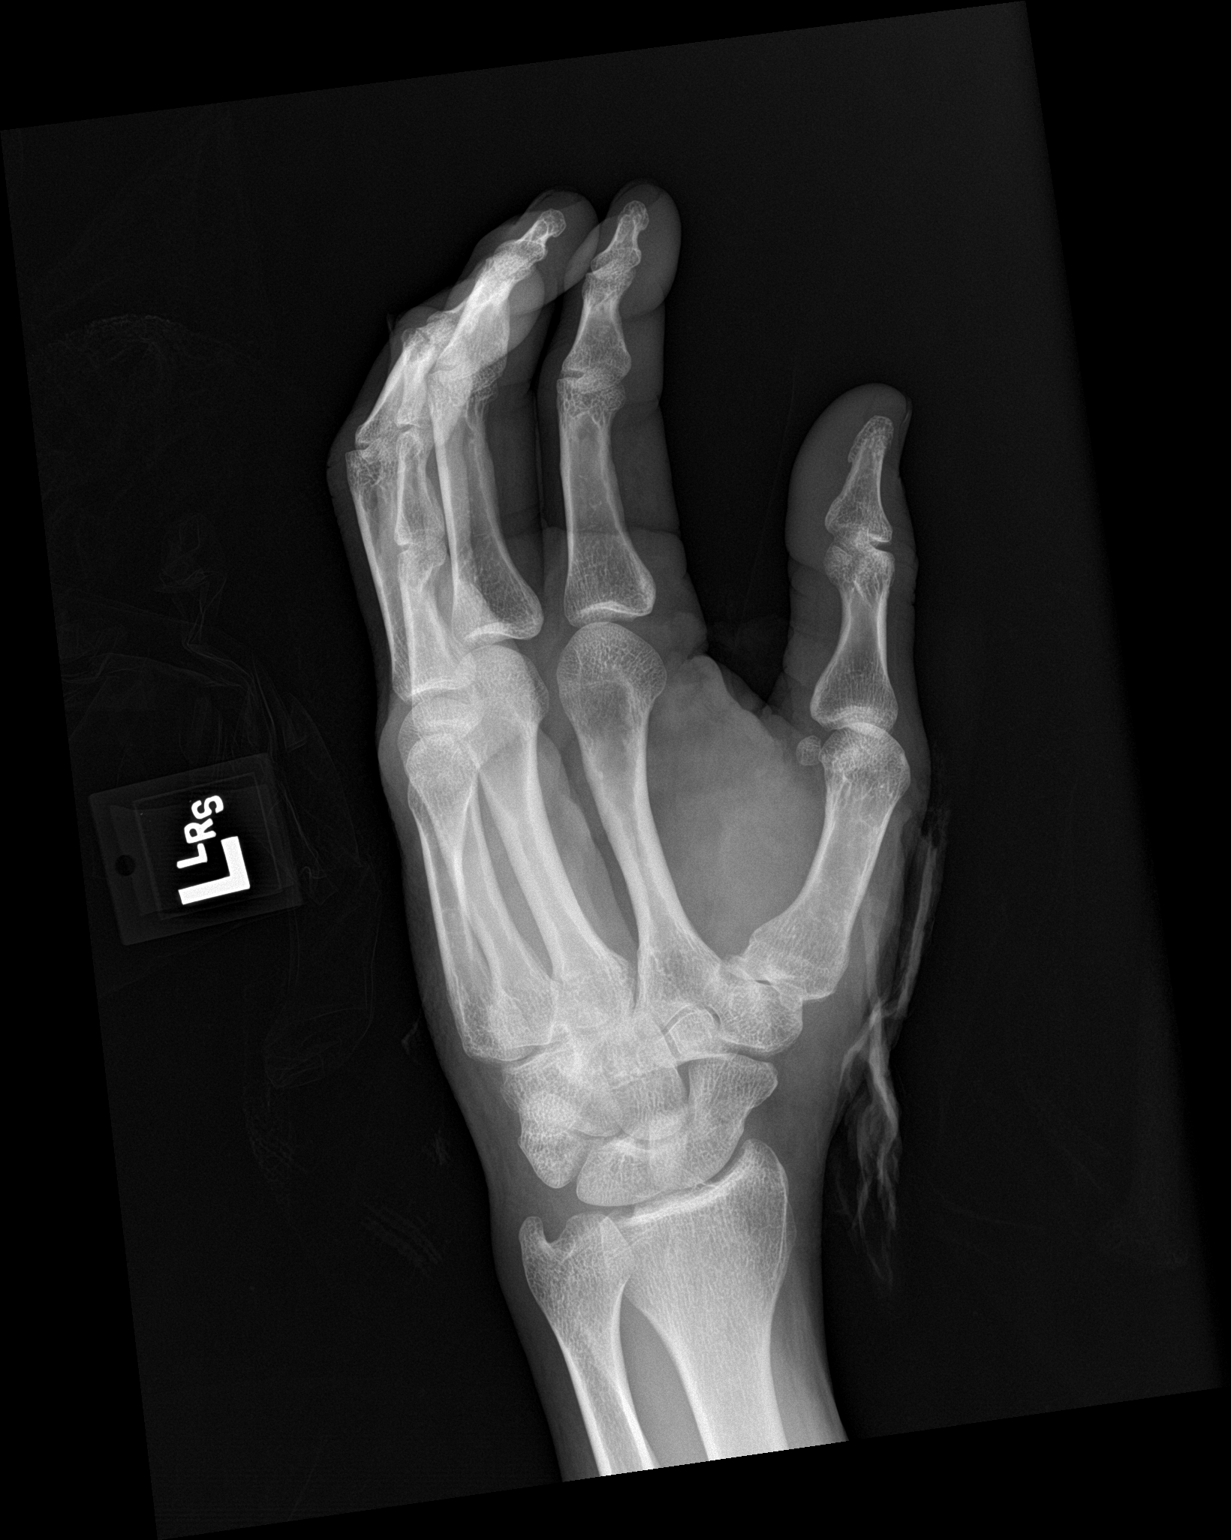

[hand lat]
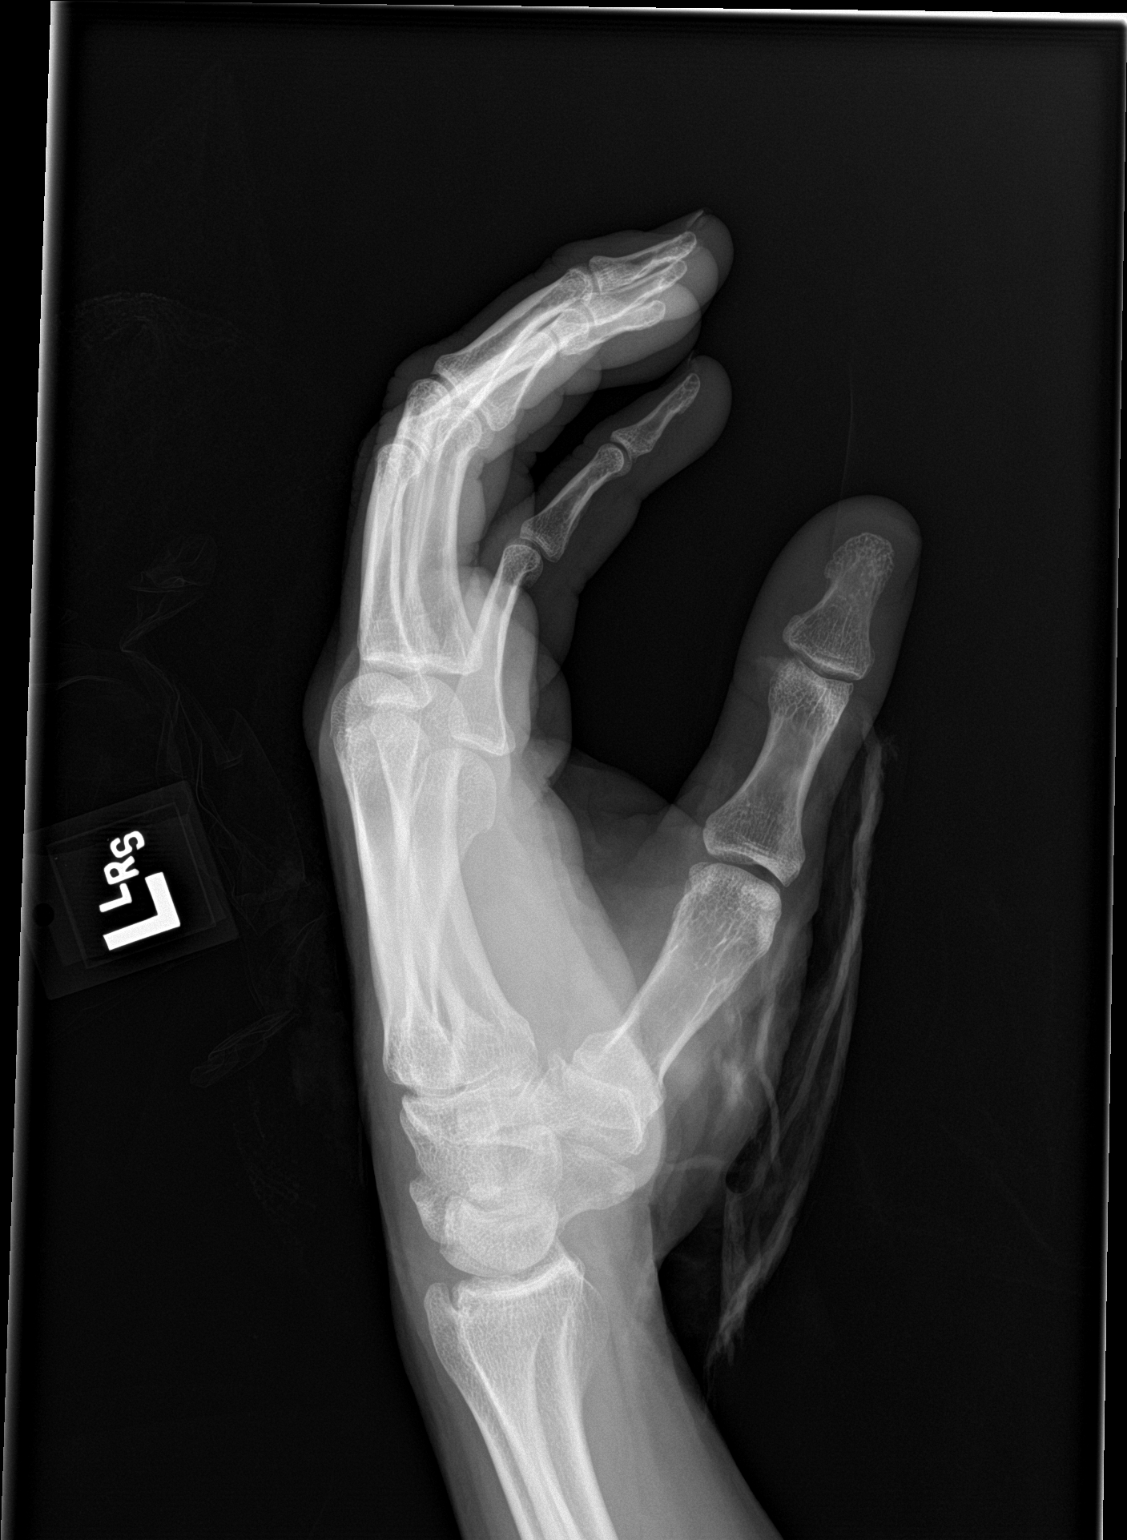

[3 of 3 positions shown; findings below may reference images not displayed]

FINDINGS: Frontal, oblique, and lateral views obtained. Soft tissue injury is
noted laterally with overlying bandage. Beyond the bandage, there is
no other radiopaque foreign body. There is no appreciable fracture
or dislocation. Joint spaces appear normal. No erosive change.
IMPRESSION: Soft tissue injury laterally with overlying bandage. No fracture or
dislocation. No evident arthropathy.

## 2022-10-25 ENCOUNTER — Other Ambulatory Visit: Payer: Self-pay

## 2022-10-25 ENCOUNTER — Emergency Department: Payer: BLUE CROSS/BLUE SHIELD

## 2022-10-25 ENCOUNTER — Emergency Department
Admission: EM | Admit: 2022-10-25 | Discharge: 2022-10-25 | Disposition: A | Discharge status: Home / Self Care | Payer: BLUE CROSS/BLUE SHIELD | Attending: Emergency Medicine | Admitting: Emergency Medicine

## 2022-10-25 DIAGNOSIS — L03116 Cellulitis of left lower limb: Secondary | ICD-10-CM | POA: Diagnosis not present

## 2022-10-25 DIAGNOSIS — M7989 Other specified soft tissue disorders: Secondary | ICD-10-CM | POA: Diagnosis present

## 2022-10-25 LAB — BASIC METABOLIC PANEL
Anion gap: 5 (ref 5–15)
BUN: 11 mg/dL (ref 6–20)
CO2: 30 mmol/L (ref 22–32)
Calcium: 8.7 mg/dL — ABNORMAL LOW (ref 8.9–10.3)
Chloride: 99 mmol/L (ref 98–111)
Creatinine, Ser: 0.73 mg/dL (ref 0.61–1.24)
GFR, Estimated: >60 mL/min (ref >60)
Glucose, Bld: 96 mg/dL (ref 70–99)
Potassium: 3.9 mmol/L (ref 3.5–5.1)
Sodium: 134 mmol/L — ABNORMAL LOW (ref 135–145)

## 2022-10-25 LAB — CBC WITH DIFFERENTIAL/PLATELET
Abs Immature Granulocytes: 0.04 10*3/uL (ref 0.00–0.07)
Basophils Absolute: 0.1 10*3/uL (ref 0.0–0.1)
Basophils Relative: 1 %
Eosinophils Absolute: 0.3 10*3/uL (ref 0.0–0.5)
Eosinophils Relative: 2 %
HCT: 40.6 % (ref 39.0–52.0)
Hemoglobin: 13.2 g/dL (ref 13.0–17.0)
Immature Granulocytes: 0 %
Lymphocytes Relative: 15 %
Lymphs Abs: 2.2 10*3/uL (ref 0.7–4.0)
MCH: 31 pg (ref 26.0–34.0)
MCHC: 32.5 g/dL (ref 30.0–36.0)
MCV: 95.3 fL (ref 80.0–100.0)
Monocytes Absolute: 1.7 10*3/uL — ABNORMAL HIGH (ref 0.1–1.0)
Monocytes Relative: 12 %
Neutro Abs: 10 10*3/uL — ABNORMAL HIGH (ref 1.7–7.7)
Neutrophils Relative %: 70 %
Platelets: 216 10*3/uL (ref 150–400)
RBC: 4.26 MIL/uL (ref 4.22–5.81)
RDW: 13.2 % (ref 11.5–15.5)
WBC: 14.2 10*3/uL — ABNORMAL HIGH (ref 4.0–10.5)
nRBC: 0 % (ref 0.0–0.2)

## 2022-10-25 LAB — LACTIC ACID, PLASMA: Lactic Acid, Venous: 1.9 mmol/L (ref 0.5–1.9)

## 2022-10-25 MED ORDER — SODIUM CHLORIDE 0.9 % IV SOLN: 2.0000 g | INTRAVENOUS | Status: DC

## 2022-10-25 MED ORDER — ONDANSETRON HCL 4 MG/2ML IJ SOLN
4.0000 mg | Freq: Once | INTRAMUSCULAR | Status: AC
  Administered 2022-10-25: 4 mg via INTRAVENOUS

## 2022-10-25 MED ORDER — CEPHALEXIN 500 MG PO CAPS: 500.0000 mg | ORAL_CAPSULE | Freq: Four times a day (QID) | ORAL | 0 refills | Status: AC

## 2022-10-25 MED ORDER — ONDANSETRON HCL 4 MG/2ML IJ SOLN
INTRAMUSCULAR | Status: AC
  Filled 2022-10-25: qty 2

## 2022-10-25 MED ORDER — MORPHINE SULFATE (PF) 4 MG/ML IV SOLN: 4.0000 mg | Freq: Once | INTRAVENOUS | Status: AC

## 2022-10-25 MED ORDER — DOXYCYCLINE MONOHYDRATE 100 MG PO TABS: 100.0000 mg | ORAL_TABLET | Freq: Two times a day (BID) | ORAL | 0 refills | Status: AC

## 2022-10-25 MED ORDER — MORPHINE SULFATE (PF) 4 MG/ML IV SOLN
INTRAVENOUS | Status: AC
  Administered 2022-10-25: 4 mg via INTRAVENOUS
  Filled 2022-10-25: qty 1

## 2022-10-25 NOTE — ED Triage Notes (Signed)
Pt here wiith left leg swelling since Monday. Pt states he was clearing brush one day and then noticed his leg was severly swollen and burning. Pt's leg is red and warm to touch.

## 2022-10-25 NOTE — Discharge Instructions (Signed)
It is strongly recommended that you stay in the hospital for your leg pain and cellulitis.  We discussed the risks of leaving the hospital which include (but not limited to) worsening infection, systemic infection, infection of your knee joint, loss of leg or life.  Please take the antibiotics as prescribed, and return to the emergency department immediately if you change your mind.  You are always welcome to come back.  Please see your outpatient provider tomorrow for recheck.  Please go to the following website to schedule new (and existing) patient appointments:   [email or web address]   The following is a list of primary care offices in the area who are accepting new patients at this time.  Please reach out to one of them directly and let them know you would like to schedule an appointment to follow up on an Emergency Department visit, and/or to establish a new primary care provider (PCP).  There are likely other primary care clinics in the are who are accepting new patients, but this is an excellent place to start:  [place] physician: Dr Lavon Paganini [address] [place], [place] [address] [phone]  [facility] Lead Physician: Dr Steele Sizer [address], [place], [place] [address] [phone]  [place] Physician: Dr Park Liter [address] [place], [place] [address] [phone]  [facility] Lead Physician: Dr Dewaine Oats [address], [place], [place] [address] [phone]  [facility] Physician: Dr Halina Maidens [address] [place], [place], [place] [address] [phone]

## 2022-10-25 NOTE — ED Provider Notes (Signed)
[facility] Provider Note    Event Date/Time   First MD Initiated Contact with Patient 10/25/22 0920     (approximate)   History   Leg Swelling   HPI  Cody Mercado is a 52 y.o. male who presents today for evaluation of left leg swelling and pain.  Patient reports that he was clearing brush on Monday and thinks that he scraped his leg on a nail.  He has had progressive swelling, redness, and pain to his leg.  No fevers or chills.  There are no problems to display for this patient.         Physical Exam   Triage Vital Signs: ED Triage Vitals  Enc Vitals Group     BP 10/25/22 0911 117/66     Pulse Rate 10/25/22 0911 68     Resp 10/25/22 0911 18     Temp 10/25/22 0911 98.5 F (36.9 C)     Temp Source 10/25/22 0911 Oral     SpO2 10/25/22 0911 100 %     Weight 10/25/22 0912 182 lb 1.6 oz (82.6 kg)     Height 10/25/22 0912 '6\' 3"'$  (1.905 m)     Head Circumference --      Peak Flow --      Pain Score 10/25/22 0912 8     Pain Loc --      Pain Edu? --      Excl. in [place]? --     Most recent vital signs: Vitals:   10/25/22 1115 10/25/22 1130  BP:    Pulse: 65 72  Resp:    Temp:    SpO2:      Physical Exam Vitals and nursing note reviewed.  Constitutional:      General: Awake and alert. No acute distress.    Appearance: Normal appearance. The patient is normal weight.  HENT:     Head: Normocephalic and atraumatic.     Mouth: Mucous membranes are moist.  Eyes:     General: PERRL. Normal EOMs        Right eye: No discharge.        Left eye: No discharge.     Conjunctiva/sclera: Conjunctivae normal.  Cardiovascular:     Rate and Rhythm: Normal rate and regular rhythm.     Pulses: Normal pulses.  Pulmonary:     Effort: Pulmonary effort is normal. No respiratory distress.     Breath sounds: Normal breath sounds.  Abdominal:     Abdomen is soft. There is no abdominal tenderness. No rebound or guarding. No distention. Musculoskeletal:         General: No swelling. Normal range of motion.     Cervical back: Normal range of motion and neck supple.  Left leg: Superficial scrapes noted just above his knee, with erythema extending distally.  He has swelling and erythema from his lower shin to just above his knee.  He is able to flex and extend his knee, though ROM is decreased and he has pain with doing so. Normal ROM at the ankle.   There is soft tissue swelling overlying the knee.  Patient has pitting edema throughout his lower leg.  2+ pedal pulse.  No visible lymphangitis.  No purulence noted. Compartments soft and compressible. Erythema is not circumferential. No fluctuance. Skin:    General: Skin is warm and dry.     Capillary Refill: Capillary refill takes less than 2 seconds.     Findings: No rash.  Neurological:     Mental Status: The patient is awake and alert.      ED Results / Procedures / Treatments   Labs (all labs ordered are listed, but only abnormal results are displayed) Labs Reviewed  BASIC METABOLIC PANEL - Abnormal; Notable for the following components:      Result Value   Sodium 134 (*)    Calcium 8.7 (*)    All other components within normal limits  CBC WITH DIFFERENTIAL/PLATELET - Abnormal; Notable for the following components:   WBC 14.2 (*)    Neutro Abs 10.0 (*)    Monocytes Absolute 1.7 (*)    All other components within normal limits  CULTURE, BLOOD (ROUTINE X 2)  CULTURE, BLOOD (ROUTINE X 2)  LACTIC ACID, PLASMA  LACTIC ACID, PLASMA     EKG     RADIOLOGY I independently reviewed and interpreted imaging and agree with radiologists findings.     PROCEDURES:  Critical Care performed:   Procedures   MEDICATIONS ORDERED IN ED: Medications  cefTRIAXone (ROCEPHIN) 2 g in sodium chloride 0.9 % 100 mL IVPB (has no administration in time range)  morphine (PF) 4 MG/ML injection 4 mg (4 mg Intravenous Given 10/25/22 1018)  ondansetron (ZOFRAN) injection 4 mg (4 mg Intravenous Given  10/25/22 1016)     IMPRESSION / MDM / ASSESSMENT AND PLAN / ED COURSE  I reviewed the triage vital signs and the nursing notes.   Differential diagnosis includes, but is not limited to, cellulitis, bursitis, septic arthritis.   Patient is awake and alert, hemodynamically stable and afebrile.  He has a large area of erythema that extends from his lower leg to his thigh.  It is not circumferential.  There is no crepitus or pain out of proportion or hemodynamic instability to suggest deep space infection.  He does have quite a bit of swelling over his knee, though he is able to range his knee minimally, no pain with axial loading, no effusion on x-ray, do not suspect septic joint at this time.  Labs were obtained which revealed leukocytosis to 14.  Normal lactate.  Duplex ultrasound obtained given unilateral swelling and does not reveal DVT.  However it does reveal a lymph node in his groin which is consistent with his cellulitis.  No focal area of fluctuance to suggest abscess.  Given the extent of his erythema and swelling I recommended admission to the hospital for IV antibiotics as I feel this is too large of an area to adequately treat with oral antibiotics, as well as the fact that it is over his joint.  He is at risk for developing a septic arthritis.  I had a long discussion with the patient regarding the risks of leaving and my recommendations for admission, however patient does not want to stay today.  He says that he has some things to take care of at home including pain pills that cannot wait.  He agrees to come back for treatment, though he understands that he might start the process all over again.  In the meantime he was given a dose of Rocephin and started on oral antibiotics, though he was advised that this will likely not treat the problem entirely, and is against my advice.  The risks of leaving against medical advice without further workup were explained to the patient in great detail.   The patient demonstrated good understanding and was able to verbalize in their own words the risks of leaving up to, and including,  death or permanent disability.  While I do not agree with their decision, their thought process is goal directed.  Their speech is appropriate and mental status is normal The patient has not demonstrated any SI.  The decision appears to be consistent with what limited knowledge I have of the patient's values.  The patient has capacity to make the decision to leave agaist medical advice.  The patient was informed that we are always happy to care for them and that they are always welcome to return at any time.  The patient understands the warning signs and symptoms that should prompt them to seek immediate emergency care.   At the very least, recommended that he see someone tomorrow for reevaluation.  He understands return precautions in the meantime.  He was discharged in the emergency [place].  RN had patient signed paperwork.    Patient's presentation is most consistent with acute presentation with potential threat to life or bodily function.   Clinical Course as of 10/25/22 1320  Thu Oct 25, 2022  1121 Patient is requesting to leave AMA.  We had a long discussion of the risks of leaving.  He agrees to undergo a dose of IV antibiotics while here, and he will be started on oral antibiotics.  He was advised that he may return anytime if he changes his mind.  He reports that he has to go pay a bill, and then he will come back [JP]    Clinical Course User Index [JP] Lashe Oliveira, Clarnce Flock, PA-C     FINAL CLINICAL IMPRESSION(S) / ED DIAGNOSES   Final diagnoses:  Cellulitis of left lower extremity     Rx / DC Orders   ED Discharge Orders          Ordered    cephALEXin (KEFLEX) 500 MG capsule  4 times daily        10/25/22 1124    doxycycline (ADOXA) 100 MG tablet  2 times daily        10/25/22 1124             Note:  This document  was prepared using Dragon voice recognition software and may include unintentional dictation errors.   Emeline Gins 10/25/22 1321    Vanessa [place], MD 10/26/22 2158

## 2022-10-28 ENCOUNTER — Inpatient Hospital Stay
Admission: EM | Admit: 2022-10-28 | Discharge: 2022-10-30 | DRG: 603 | Discharge status: Against Medical Advice | Payer: BLUE CROSS/BLUE SHIELD | Attending: Student | Admitting: Student

## 2022-10-28 ENCOUNTER — Observation Stay: Payer: BLUE CROSS/BLUE SHIELD

## 2022-10-28 ENCOUNTER — Other Ambulatory Visit: Payer: Self-pay

## 2022-10-28 ENCOUNTER — Encounter: Payer: Self-pay | Admitting: Radiology

## 2022-10-28 DIAGNOSIS — L02416 Cutaneous abscess of left lower limb: Secondary | ICD-10-CM | POA: Diagnosis not present

## 2022-10-28 DIAGNOSIS — B1920 Unspecified viral hepatitis C without hepatic coma: Secondary | ICD-10-CM | POA: Diagnosis present

## 2022-10-28 DIAGNOSIS — F1410 Cocaine abuse, uncomplicated: Secondary | ICD-10-CM | POA: Diagnosis present

## 2022-10-28 DIAGNOSIS — L03116 Cellulitis of left lower limb: Principal | ICD-10-CM | POA: Diagnosis present

## 2022-10-28 DIAGNOSIS — F1110 Opioid abuse, uncomplicated: Secondary | ICD-10-CM | POA: Diagnosis present

## 2022-10-28 DIAGNOSIS — M71162 Other infective bursitis, left knee: Secondary | ICD-10-CM | POA: Diagnosis present

## 2022-10-28 DIAGNOSIS — F1190 Opioid use, unspecified, uncomplicated: Secondary | ICD-10-CM | POA: Diagnosis not present

## 2022-10-28 DIAGNOSIS — Z5329 Procedure and treatment not carried out because of patient's decision for other reasons: Secondary | ICD-10-CM | POA: Diagnosis present

## 2022-10-28 DIAGNOSIS — F17210 Nicotine dependence, cigarettes, uncomplicated: Secondary | ICD-10-CM | POA: Diagnosis present

## 2022-10-28 DIAGNOSIS — Z885 Allergy status to narcotic agent status: Secondary | ICD-10-CM

## 2022-10-28 LAB — COMPREHENSIVE METABOLIC PANEL
ALT: 39 U/L (ref 0–44)
ALT: 36 U/L (ref 0–44)
AST: 56 U/L — ABNORMAL HIGH (ref 15–41)
AST: 53 U/L — ABNORMAL HIGH (ref 15–41)
Albumin: 3.2 g/dL — ABNORMAL LOW (ref 3.5–5.0)
Albumin: 3 g/dL — ABNORMAL LOW (ref 3.5–5.0)
Alkaline Phosphatase: 75 U/L (ref 38–126)
Alkaline Phosphatase: 74 U/L (ref 38–126)
Anion gap: 4 — ABNORMAL LOW (ref 5–15)
Anion gap: 5 (ref 5–15)
BUN: 10 mg/dL (ref 6–20)
BUN: 9 mg/dL (ref 6–20)
CO2: 29 mmol/L (ref 22–32)
CO2: 29 mmol/L (ref 22–32)
Calcium: 8.4 mg/dL — ABNORMAL LOW (ref 8.9–10.3)
Calcium: 8.2 mg/dL — ABNORMAL LOW (ref 8.9–10.3)
Chloride: 100 mmol/L (ref 98–111)
Chloride: 100 mmol/L (ref 98–111)
Creatinine, Ser: 0.63 mg/dL (ref 0.61–1.24)
Creatinine, Ser: 0.71 mg/dL (ref 0.61–1.24)
GFR, Estimated: >60 mL/min (ref >60)
GFR, Estimated: >60 mL/min (ref >60)
Glucose, Bld: 107 mg/dL — ABNORMAL HIGH (ref 70–99)
Glucose, Bld: 98 mg/dL (ref 70–99)
Potassium: 4.1 mmol/L (ref 3.5–5.1)
Potassium: 4.2 mmol/L (ref 3.5–5.1)
Sodium: 133 mmol/L — ABNORMAL LOW (ref 135–145)
Sodium: 134 mmol/L — ABNORMAL LOW (ref 135–145)
Total Bilirubin: 0.8 mg/dL (ref 0.3–1.2)
Total Bilirubin: 0.9 mg/dL (ref 0.3–1.2)
Total Protein: 7.1 g/dL (ref 6.5–8.1)
Total Protein: 6.9 g/dL (ref 6.5–8.1)

## 2022-10-28 LAB — CBC WITH DIFFERENTIAL/PLATELET
Abs Immature Granulocytes: 0.06 10*3/uL (ref 0.00–0.07)
Basophils Absolute: 0.1 10*3/uL (ref 0.0–0.1)
Basophils Relative: 1 %
Eosinophils Absolute: 0.3 10*3/uL (ref 0.0–0.5)
Eosinophils Relative: 2 %
HCT: 43.7 % (ref 39.0–52.0)
Hemoglobin: 14.1 g/dL (ref 13.0–17.0)
Immature Granulocytes: 0 %
Lymphocytes Relative: 17 %
Lymphs Abs: 2.4 10*3/uL (ref 0.7–4.0)
MCH: 30.7 pg (ref 26.0–34.0)
MCHC: 32.3 g/dL (ref 30.0–36.0)
MCV: 95 fL (ref 80.0–100.0)
Monocytes Absolute: 1.7 10*3/uL — ABNORMAL HIGH (ref 0.1–1.0)
Monocytes Relative: 11 %
Neutro Abs: 10.3 10*3/uL — ABNORMAL HIGH (ref 1.7–7.7)
Neutrophils Relative %: 69 %
Platelets: 300 10*3/uL (ref 150–400)
RBC: 4.6 MIL/uL (ref 4.22–5.81)
RDW: 12.9 % (ref 11.5–15.5)
WBC: 14.8 10*3/uL — ABNORMAL HIGH (ref 4.0–10.5)
nRBC: 0 % (ref 0.0–0.2)

## 2022-10-28 LAB — CBC
HCT: 40.6 % (ref 39.0–52.0)
Hemoglobin: 13.4 g/dL (ref 13.0–17.0)
MCH: 30.9 pg (ref 26.0–34.0)
MCHC: 33 g/dL (ref 30.0–36.0)
MCV: 93.5 fL (ref 80.0–100.0)
Platelets: 291 10*3/uL (ref 150–400)
RBC: 4.34 MIL/uL (ref 4.22–5.81)
RDW: 12.9 % (ref 11.5–15.5)
WBC: 12.8 10*3/uL — ABNORMAL HIGH (ref 4.0–10.5)
nRBC: 0 % (ref 0.0–0.2)

## 2022-10-28 LAB — LACTIC ACID, PLASMA
Lactic Acid, Venous: 1.1 mmol/L (ref 0.5–1.9)
Lactic Acid, Venous: 1.3 mmol/L (ref 0.5–1.9)

## 2022-10-28 MED ORDER — ONDANSETRON HCL 4 MG PO TABS: 4.0000 mg | ORAL_TABLET | Freq: Four times a day (QID) | ORAL | Status: DC | PRN

## 2022-10-28 MED ORDER — ACETAMINOPHEN 650 MG RE SUPP: 650.0000 mg | Freq: Four times a day (QID) | RECTAL | Status: DC | PRN

## 2022-10-28 MED ORDER — LIDOCAINE-EPINEPHRINE 2 %-1:100000 IJ SOLN
30.0000 mL | Freq: Once | INTRAMUSCULAR | Status: AC
  Administered 2022-10-28: 30 mL
  Filled 2022-10-28: qty 2

## 2022-10-28 MED ORDER — SODIUM CHLORIDE 0.9% FLUSH
3.0000 mL | Freq: Two times a day (BID) | INTRAVENOUS | Status: DC
  Administered 2022-10-28 – 2022-10-29 (×3): 3 mL via INTRAVENOUS

## 2022-10-28 MED ORDER — DIAZEPAM 5 MG PO TABS
5.0000 mg | ORAL_TABLET | Freq: Once | ORAL | Status: AC
  Administered 2022-10-28: 5 mg via ORAL
  Filled 2022-10-28: qty 1

## 2022-10-28 MED ORDER — SODIUM CHLORIDE 0.9 % IV BOLUS (SEPSIS)
1000.0000 mL | Freq: Once | INTRAVENOUS | Status: AC
  Administered 2022-10-28: 1000 mL via INTRAVENOUS

## 2022-10-28 MED ORDER — SODIUM CHLORIDE 0.9 % IV SOLN
2.0000 g | Freq: Once | INTRAVENOUS | Status: AC
  Administered 2022-10-28: 2 g via INTRAVENOUS
  Filled 2022-10-28: qty 12.5

## 2022-10-28 MED ORDER — HYDROMORPHONE HCL 1 MG/ML IJ SOLN: 1.0000 mg | INTRAMUSCULAR | Status: DC | PRN

## 2022-10-28 MED ORDER — MIDAZOLAM HCL 2 MG/2ML IJ SOLN
1.0000 mg | Freq: Once | INTRAMUSCULAR | Status: AC
  Administered 2022-10-28: 1 mg via INTRAVENOUS
  Filled 2022-10-28: qty 2

## 2022-10-28 MED ORDER — VANCOMYCIN HCL 1250 MG/250ML IV SOLN
1250.0000 mg | Freq: Two times a day (BID) | INTRAVENOUS | Status: DC
  Administered 2022-10-29 (×3): 1250 mg via INTRAVENOUS
  Filled 2022-10-28 (×4): qty 250

## 2022-10-28 MED ORDER — HYDROCODONE-ACETAMINOPHEN 5-325 MG PO TABS
1.0000 | ORAL_TABLET | Freq: Four times a day (QID) | ORAL | Status: DC | PRN
  Administered 2022-10-28 – 2022-10-29 (×3): 1 via ORAL
  Filled 2022-10-28 (×3): qty 1

## 2022-10-28 MED ORDER — GADOBUTROL 1 MMOL/ML IV SOLN
8.0000 mL | Freq: Once | INTRAVENOUS | Status: AC | PRN
  Administered 2022-10-28: 8 mL via INTRAVENOUS

## 2022-10-28 MED ORDER — MORPHINE SULFATE (PF) 4 MG/ML IV SOLN
4.0000 mg | Freq: Once | INTRAVENOUS | Status: AC
  Administered 2022-10-28: 4 mg via INTRAVENOUS
  Filled 2022-10-28: qty 1

## 2022-10-28 MED ORDER — POLYETHYLENE GLYCOL 3350 17 G PO PACK: 17.0000 g | PACK | Freq: Every day | ORAL | Status: DC | PRN

## 2022-10-28 MED ORDER — SODIUM CHLORIDE 0.9 % IV SOLN
2.0000 g | Freq: Three times a day (TID) | INTRAVENOUS | Status: DC
  Administered 2022-10-28 – 2022-10-30 (×5): 2 g via INTRAVENOUS
  Filled 2022-10-28 (×2): qty 2
  Filled 2022-10-28 (×4): qty 12.5

## 2022-10-28 MED ORDER — ACETAMINOPHEN 325 MG PO TABS
650.0000 mg | ORAL_TABLET | Freq: Four times a day (QID) | ORAL | Status: DC | PRN
  Administered 2022-10-30: 650 mg via ORAL
  Filled 2022-10-28: qty 2

## 2022-10-28 MED ORDER — VANCOMYCIN HCL 1750 MG/350ML IV SOLN
1750.0000 mg | Freq: Once | INTRAVENOUS | Status: AC
  Administered 2022-10-28: 1750 mg via INTRAVENOUS
  Filled 2022-10-28: qty 350

## 2022-10-28 MED ORDER — ONDANSETRON HCL 4 MG/2ML IJ SOLN: 4.0000 mg | Freq: Four times a day (QID) | INTRAMUSCULAR | Status: DC | PRN

## 2022-10-28 NOTE — Consult Note (Signed)
PHARMACY -  BRIEF ANTIBIOTIC NOTE   Pharmacy has received consult(s) for vancomycin from an ED provider.  The patient's profile has been reviewed for ht/wt/allergies/indication/available labs.    One time order(s) placed for vancomycin 1750 mg x1  Further antibiotics/pharmacy consults should be ordered by admitting physician if indicated.                       Thank you,  Gretel Acre, PharmD PGY1 Pharmacy Resident 10/28/2022 10:47 AM

## 2022-10-28 NOTE — ED Triage Notes (Signed)
Pt reports here for cellulitis in his left leg. Pt reports was seen here for the same several days ago and they wanted to admit him but he wanted to try taking antibiotics at home first. Pt reports took meds as prescribed but his leg has gotten much worse.

## 2022-10-28 NOTE — Consult Note (Signed)
Pharmacy Antibiotic Note  Cody Mercado is a 52 y.o. male admitted on 10/28/2022 with wound infection with possible septic arthritis. PMH significant for OUD. Patient reports that he knelt on a nail at work 3/11. Last tetanus shot in 2021. He presented to the ED 3/14 for with approximately 1 week of K knee redness, swelling, and pain with limited ability to bear weight. He left AMA after one dose of ceftriaxone and prescriptions for Keflex and doxycycline were sent to home pharmacy. Abscess of L knee was drained by EDP 3/15. Pharmacy has been consulted for vancomycin dosing.  Plan: Day 1 of antibiotics Give vancomycin 1750 mg IV x1 followed by 1250 mg IV Q12H. Goal AUC 400-550. Expected AUC: 452.6 Expected Css min: 11.7 SCr used: 0.8 (actual 0.63)  Weight used: IBW, Vd used: 0.72 (BMI 22.5) Patient is also cefepime 2 g IV Q8H Continue to monitor renal function and follow culture results   Height: 6\' 3"  (190.5 cm) Weight: 82 kg (180 lb 12.4 oz) IBW/kg (Calculated) : 84.5  Temp (24hrs), Avg:98 F (36.7 C), Min:98 F (36.7 C), Max:98 F (36.7 C)  Recent Labs  Lab 10/25/22 0957 10/28/22 0906 10/28/22 1101  WBC 14.2* 14.8*  --   CREATININE 0.73 0.63  --   LATICACIDVEN 1.9 1.1 1.3    Estimated Creatinine Clearance: 129.5 mL/min (by C-G formula based on SCr of 0.63 mg/dL).    Allergies  Allergen Reactions   Tramadol Nausea And Vomiting    Antimicrobials this admission: 3/17 Vancomycin >>  3/17 Cefepime >>   Dose adjustments this admission: N/A  Microbiology results: 3/17 BCx: IP  Thank you for allowing pharmacy to be a part of this patient's care.  Gretel Acre, PharmD PGY1 Pharmacy Resident 10/28/2022 1:28 PM

## 2022-10-28 NOTE — ED Provider Notes (Signed)
[facility] Provider Note    Event Date/Time   First MD Initiated Contact with Patient 10/28/22 1016     (approximate)   History   Wound Infection   HPI  Cody Mercado is a 52 y.o. male   Past medical history of no significant past medical history presents emergency department with worsening pain redness warmth to the left lower extremity.  He was seen in the emergency department recently and diagnosed with cellulitis to the left lower extremity and left AMA.  He comes back due to worsening pain.  He denies systemic infectious symptoms like fevers or chills.  Started when he scraped the anterior knee On what he believes to be a nail.  His last Tdap within the last 2 years.  He has been taking oral antibiotics without relief.     External Medical Documents Reviewed: Emergency department note dated 10/25/2022 when he was diagnosed with cellulitis of left lower extremity and left AMA      Physical Exam   Triage Vital Signs: ED Triage Vitals  Enc Vitals Group     BP 10/28/22 0909 (!) 105/59     Pulse Rate 10/28/22 0909 68     Resp 10/28/22 0909 18     Temp 10/28/22 0909 98 F (36.7 C)     Temp src --      SpO2 10/28/22 0909 97 %     Weight 10/28/22 0901 180 lb 12.4 oz (82 kg)     Height 10/28/22 0901 6\' 3"  (1.905 m)     Head Circumference --      Peak Flow --      Pain Score 10/28/22 0901 8     Pain Loc --      Pain Edu? --      Excl. in [place]? --     Most recent vital signs: Vitals:   10/28/22 0909  BP: (!) 105/59  Pulse: 68  Resp: 18  Temp: 98 F (36.7 C)  SpO2: 97%    General: Awake, no distress.  CV:  Good peripheral perfusion.  Resp:  Normal effort.  Abd:  No distention.  Other:  Awake alert comfortable appearing nontoxic.  Vital signs significant for borderline low blood pressure 105/59 otherwise normal afebrile.  He has some fluctuance to the anterior left knee and some cellulitic changes overlying the knee Extending down to  the shin, neurovascular intact and able to range the left knee gingerly  Bedside ultrasound to the fluctuant area in the anterior knee shows fluid collection   ED Results / Procedures / Treatments   Labs (all labs ordered are listed, but only abnormal results are displayed) Labs Reviewed  COMPREHENSIVE METABOLIC PANEL - Abnormal; Notable for the following components:      Result Value   Sodium 133 (*)    Glucose, Bld 107 (*)    Calcium 8.4 (*)    Albumin 3.2 (*)    AST 56 (*)    Anion gap 4 (*)    All other components within normal limits  CBC WITH DIFFERENTIAL/PLATELET - Abnormal; Notable for the following components:   WBC 14.8 (*)    Neutro Abs 10.3 (*)    Monocytes Absolute 1.7 (*)    All other components within normal limits  CULTURE, BLOOD (ROUTINE X 2)  CULTURE, BLOOD (ROUTINE X 2)  LACTIC ACID, PLASMA  LACTIC ACID, PLASMA     I ordered and reviewed the above labs they are notable for  cell count is 14.8 which is slightly elevated from prior      PROCEDURES:  Critical Care performed: No  ..Incision and Drainage  Date/Time: 10/28/2022 12:36 PM  Performed by: Lucillie Garfinkel, MD Authorized by: Lucillie Garfinkel, MD   Consent:    Consent obtained:  Verbal   Consent given by:  Patient   Risks, benefits, and alternatives were discussed: yes     Risks discussed:  Bleeding, incomplete drainage, infection, damage to other organs and pain   Alternatives discussed:  No treatment, delayed treatment, alternative treatment and observation Universal protocol:    Procedure explained and questions answered to patient or proxy's satisfaction: yes     Patient identity confirmed:  Verbally with patient Location:    Type:  Abscess   Size:  5cm   Location:  Lower extremity   Lower extremity location:  Knee   Knee location:  L knee Pre-procedure details:    Skin preparation:  Povidone-iodine Sedation:    Sedation type:  Anxiolysis Anesthesia:    Anesthesia method:  Local  infiltration   Local anesthetic:  Lidocaine 1% WITH epi Procedure type:    Complexity:  Complex Procedure details:    Ultrasound guidance: no     Needle aspiration: no     Incision types:  Stab incision   Incision depth:  Subcutaneous   Wound management:  Probed and deloculated   Drainage:  Purulent   Drainage amount:  Copious   Wound treatment:  Wound left open   Packing materials:  1/4 in iodoform gauze Post-procedure details:    Procedure completion:  Tolerated    MEDICATIONS ORDERED IN ED: Medications  vancomycin (VANCOREADY) IVPB 1750 mg/350 mL (has no administration in time range)  diazepam (VALIUM) tablet 5 mg (has no administration in time range)  morphine (PF) 4 MG/ML injection 4 mg (has no administration in time range)  sodium chloride 0.9 % bolus 1,000 mL (0 mLs Intravenous Stopped 10/28/22 1236)  ceFEPIme (MAXIPIME) 2 g in sodium chloride 0.9 % 100 mL IVPB (0 g Intravenous Stopped 10/28/22 1236)  lidocaine-EPINEPHrine (XYLOCAINE W/EPI) 2 %-1:100000 (with pres) injection 30 mL (30 mLs Other Given 10/28/22 1237)  midazolam (VERSED) injection 1 mg (1 mg Intravenous Given 10/28/22 1103)    External physician / consultants:  I spoke with hospitalist for admission and regarding care plan for this patient.   IMPRESSION / MDM / ASSESSMENT AND PLAN / ED COURSE  I reviewed the triage vital signs and the nursing notes.                                Patient's presentation is most consistent with acute presentation with potential threat to life or bodily function.  Differential diagnosis includes, but is not limited to, cellulitis, abscess, septic joint, sepsis   The patient is on the cardiac monitor to evaluate for evidence of arrhythmia and/or significant heart rate changes.  MDM: Is a patient with worsening cellulitic changes to the left knee now with what appears to be a fluid collection on the bedside ultrasound underneath the kneecap.  It is most likely abscess with  cellulitic changes.  Borderline hypotension will give fluids as well as IV vancomycin and cefepime.  Get blood cultures and lactic.  Plan for admission given failure of oral antibiotics and severe cellulitis.  I do not think it is septic joint as he is able to range.  I am not going to  aspirate the joint given overlying cellulitic changes.         FINAL CLINICAL IMPRESSION(S) / ED DIAGNOSES   Final diagnoses:  Cellulitis and abscess of left leg     Rx / DC Orders   ED Discharge Orders     None        Note:  This document was prepared using Dragon voice recognition software and may include unintentional dictation errors.    Lucillie Garfinkel, MD 10/28/22 [phone]

## 2022-10-28 NOTE — Assessment & Plan Note (Addendum)
Patient denies any substance abuse at this time, but per chart review, he has a history of IV substance use dating back into 2017.  Given slight elevation in AST, will check for HIV and hepatitis B/C.  - Screening tests for HIV, hep C and hep B ordered - Repeat CMP in the a.m.

## 2022-10-28 NOTE — H&P (Signed)
History and Physical    Patient: Cody Mercado [id number] DOB: 09-18-1970 DOA: 10/28/2022 DOS: the patient was seen and examined on 10/28/2022 PCP: Patient, No Pcp Per  Patient coming from: Home  Chief Complaint:  Chief Complaint  Patient presents with   Wound Infection   HPI: Cody Mercado is a 52 y.o. male with medical history significant of opioid use disorder, who presents to the ED due to wound infection.  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  His knee landed on a nail that broke the skin.  Within 24 hours, he had significant pain of the left knee and over the next 3 days, he began to experience swelling and redness.  He endorses chills and malaise at that time.  He was unable to bear weight on his leg.  Due to that, he came to the ED but was unable to stay due to other obligations.  He tried going back to work but notes hot and cold chills continued in addition to nausea and significant left knee pain.  He has been taking antibiotics hours prescribed on the 14th.  He denies any shortness of breath, chest pain, palpitations, vomiting, diarrhea, abdominal pain.  Last tetanus shot was in 2021.  Per chart review, patient was seen in the ED on 10/25/2022 for the same issue.  At the time, he was given a dose of Rocephin but patient signed out AMA.  Prior to discharge, Keflex and doxycycline were sent into the pharmacy.  ED course: On arrival to the ED, patient was normotensive at 105/59 with heart rate of 68.  He was saturating at 97% on room air.  He was afebrile at 98.  Initial workup notable for WBC of 14.8, hemoglobin of 14.1, sodium of 133, glucose of 107, creatinine 0.63, AST 56, ALT 39 and GFR above 60.  Lactic acid within normal limits.  Patient started on vancomycin and cefepime.  Abscess of the left knee was drained by EDP.  [facility] contacted for admission.  Review of Systems: As mentioned in  the history of present illness. All other systems reviewed and are negative.  No past medical history on file.  No past surgical history on file.  Social History:  reports that he has been smoking cigarettes. He has never used smokeless tobacco. He reports current alcohol use. He reports current drug use.  Allergies  Allergen Reactions   Tramadol Nausea And Vomiting    No family history on file.  Prior to Admission medications   Medication Sig Start Date End Date Taking? Authorizing Provider  cephALEXin (KEFLEX) 500 MG capsule Take 1 capsule (500 mg total) by mouth 4 (four) times daily for 10 days. 10/25/22 11/04/22  Poggi, Clarnce Flock, PA-C  doxycycline (ADOXA) 100 MG tablet Take 1 tablet (100 mg total) by mouth 2 (two) times daily for 10 days. 10/25/22 11/04/22  Poggi, Clarnce Flock, PA-C    Physical Exam: Vitals:   10/28/22 0901 10/28/22 0909  BP:  (!) 105/59  Pulse:  68  Resp:  18  Temp:  98 F (36.7 C)  SpO2:  97%  Weight: 82 kg   Height: 6\' 3"  (1.905 m)    Physical Exam Vitals and nursing note reviewed.  Constitutional:      General: He is in acute distress (2/2 pain).  HENT:     Head: Normocephalic and atraumatic.  Mouth/Throat:     Mouth: Mucous membranes are moist.     Pharynx: Oropharynx is clear.  Eyes:     Conjunctiva/sclera: Conjunctivae normal.     Pupils: Pupils are equal, round, and reactive to light.  Cardiovascular:     Rate and Rhythm: Normal rate and regular rhythm.     Heart sounds: No murmur heard.    No gallop.  Pulmonary:     Effort: Pulmonary effort is normal. No respiratory distress.     Breath sounds: Normal breath sounds. No wheezing, rhonchi or rales.  Abdominal:     General: Bowel sounds are normal.     Palpations: Abdomen is soft.     Tenderness: There is no abdominal tenderness.  Musculoskeletal:     Right lower leg: No edema.     Left lower leg: Edema (1-2+ pitting edema) present.  Skin:    General: Skin is warm and dry.     Comments:  Overlying the left patella, there is significant skin erythema that is violet in color with fluctuance and tenderness to palpation, with approximately 1 cm incision packed with packing strips.  From that area, going both towards the thigh in the calf, there is erythema with less tenderness to palpation.  Notable swelling from the knee down.  Neurological:     General: No focal deficit present.     Mental Status: He is alert and oriented to person, place, and time.  Psychiatric:        Mood and Affect: Affect normal. Mood is anxious.        Behavior: Behavior normal. Behavior is cooperative.    Data Reviewed: CBC with WBC of 14.8, hemoglobin of 14.4, platelets of 300 CMP with sodium of 133, potassium 4.1, bicarb 29, glucose 107, BUN 10, creatinine 0.63 with GFR above 60, AST 56, ALT 39, alkaline phosphatase 75. Lactic acid within normal limits x 2  Blood cultures obtained and pending  There are no new results to review at this time.  Assessment and Plan:  * Cellulitis and abscess of left lower extremity Patient presenting with approximately 1 week of left knee redness, swelling, and pain has limited his ability to bear weight.  Abscess was noted by EDP on ultrasound with drainage performed; unfortunately purulent drainage was not sent for culture.  Given proximity to the knee joint, I am concerned for possible septic arthritis.  Given cellulitis is overlying the entire knee will obtain MRI before attempting to enter the joint.  - MRI knee with and without - Continue vancomycin and cefepime - Blood cultures pending  Opioid use disorder Patient denies any substance abuse at this time, but per chart review, he has a history of IV substance use dating back into 2017.  Given slight elevation in AST, will check for HIV and hepatitis B/C.  - Screening tests for HIV, hep C and hep B ordered - Repeat CMP in the a.m.  Advance Care Planning:   Code Status: Full Code verified by  patient  Consults: None at this time  Family Communication: No family at bedside  Severity of Illness: The appropriate patient status for this patient is OBSERVATION. Observation status is judged to be reasonable and necessary in order to provide the required intensity of service to ensure the patient's safety. The patient's presenting symptoms, physical exam findings, and initial radiographic and laboratory data in the context of their medical condition is felt to place them at decreased risk for further clinical deterioration. Furthermore, it is  anticipated that the patient will be medically stable for discharge from the hospital within 2 midnights of admission.   Author: Jose Persia, MD 10/28/2022 1:07 PM  For on call review [email or web address].

## 2022-10-28 NOTE — Assessment & Plan Note (Signed)
Patient presenting with approximately 1 week of left knee redness, swelling, and pain has limited his ability to bear weight.  Abscess was noted by EDP on ultrasound with drainage performed; unfortunately purulent drainage was not sent for culture.  Given proximity to the knee joint, I am concerned for possible septic arthritis.  Given cellulitis is overlying the entire knee will obtain MRI before attempting to enter the joint.  - MRI knee with and without - Continue vancomycin and cefepime - Blood cultures pending

## 2022-10-29 ENCOUNTER — Inpatient Hospital Stay: Payer: BLUE CROSS/BLUE SHIELD | Admitting: Certified Registered Nurse Anesthetist

## 2022-10-29 ENCOUNTER — Encounter: Payer: Self-pay | Admitting: Internal Medicine

## 2022-10-29 ENCOUNTER — Encounter
Admission: EM | Discharge status: Against Medical Advice | Payer: Self-pay | Source: Home / Self Care | Attending: Student

## 2022-10-29 ENCOUNTER — Other Ambulatory Visit: Payer: Self-pay

## 2022-10-29 DIAGNOSIS — L02416 Cutaneous abscess of left lower limb: Secondary | ICD-10-CM | POA: Diagnosis not present

## 2022-10-29 DIAGNOSIS — M71162 Other infective bursitis, left knee: Secondary | ICD-10-CM | POA: Diagnosis present

## 2022-10-29 DIAGNOSIS — F1110 Opioid abuse, uncomplicated: Secondary | ICD-10-CM | POA: Diagnosis present

## 2022-10-29 DIAGNOSIS — F17210 Nicotine dependence, cigarettes, uncomplicated: Secondary | ICD-10-CM | POA: Diagnosis present

## 2022-10-29 DIAGNOSIS — Z885 Allergy status to narcotic agent status: Secondary | ICD-10-CM | POA: Diagnosis not present

## 2022-10-29 DIAGNOSIS — B1920 Unspecified viral hepatitis C without hepatic coma: Secondary | ICD-10-CM | POA: Diagnosis present

## 2022-10-29 DIAGNOSIS — F1410 Cocaine abuse, uncomplicated: Secondary | ICD-10-CM | POA: Diagnosis present

## 2022-10-29 DIAGNOSIS — L03116 Cellulitis of left lower limb: Secondary | ICD-10-CM | POA: Diagnosis present

## 2022-10-29 DIAGNOSIS — Z5329 Procedure and treatment not carried out because of patient's decision for other reasons: Secondary | ICD-10-CM | POA: Diagnosis present

## 2022-10-29 HISTORY — PX: INCISION AND DRAINAGE: SHX5863

## 2022-10-29 LAB — CBC
HCT: 37.6 % — ABNORMAL LOW (ref 39.0–52.0)
Hemoglobin: 12.5 g/dL — ABNORMAL LOW (ref 13.0–17.0)
MCH: 31.1 pg (ref 26.0–34.0)
MCHC: 33.2 g/dL (ref 30.0–36.0)
MCV: 93.5 fL (ref 80.0–100.0)
Platelets: 264 10*3/uL (ref 150–400)
RBC: 4.02 MIL/uL — ABNORMAL LOW (ref 4.22–5.81)
RDW: 12.8 % (ref 11.5–15.5)
WBC: 11.3 10*3/uL — ABNORMAL HIGH (ref 4.0–10.5)
nRBC: 0 % (ref 0.0–0.2)

## 2022-10-29 LAB — COMPREHENSIVE METABOLIC PANEL
ALT: 34 U/L (ref 0–44)
AST: 45 U/L — ABNORMAL HIGH (ref 15–41)
Albumin: 2.6 g/dL — ABNORMAL LOW (ref 3.5–5.0)
Alkaline Phosphatase: 62 U/L (ref 38–126)
Anion gap: 6 (ref 5–15)
BUN: 8 mg/dL (ref 6–20)
CO2: 26 mmol/L (ref 22–32)
Calcium: 7.9 mg/dL — ABNORMAL LOW (ref 8.9–10.3)
Chloride: 102 mmol/L (ref 98–111)
Creatinine, Ser: 0.53 mg/dL — ABNORMAL LOW (ref 0.61–1.24)
GFR, Estimated: >60 mL/min (ref >60)
Glucose, Bld: 96 mg/dL (ref 70–99)
Potassium: 4 mmol/L (ref 3.5–5.1)
Sodium: 134 mmol/L — ABNORMAL LOW (ref 135–145)
Total Bilirubin: 0.9 mg/dL (ref 0.3–1.2)
Total Protein: 5.9 g/dL — ABNORMAL LOW (ref 6.5–8.1)

## 2022-10-29 LAB — URINE DRUG SCREEN, QUALITATIVE (ARMC ONLY)
Amphetamines, Ur Screen: NOT DETECTED
Barbiturates, Ur Screen: NOT DETECTED
Benzodiazepine, Ur Scrn: POSITIVE — AB
Cannabinoid 50 Ng, Ur ~~LOC~~: POSITIVE — AB
Cocaine Metabolite,Ur ~~LOC~~: NOT DETECTED
MDMA (Ecstasy)Ur Screen: NOT DETECTED
Methadone Scn, Ur: NOT DETECTED
Opiate, Ur Screen: POSITIVE — AB
Phencyclidine (PCP) Ur S: NOT DETECTED
Tricyclic, Ur Screen: NOT DETECTED

## 2022-10-29 LAB — HEPATITIS B SURFACE ANTIGEN: Hepatitis B Surface Ag: NONREACTIVE

## 2022-10-29 LAB — HIV ANTIBODY (ROUTINE TESTING W REFLEX): HIV Screen 4th Generation wRfx: NONREACTIVE

## 2022-10-29 LAB — HEPATITIS C ANTIBODY: HCV Ab: REACTIVE — AB

## 2022-10-29 LAB — HEPATITIS B CORE ANTIBODY, TOTAL: Hep B Core Total Ab: NONREACTIVE

## 2022-10-29 SURGERY — INCISION AND DRAINAGE
Anesthesia: General | Site: Knee | Laterality: Left

## 2022-10-29 MED ORDER — LIDOCAINE HCL (PF) 2 % IJ SOLN
INTRAMUSCULAR | Status: AC
  Filled 2022-10-29: qty 5

## 2022-10-29 MED ORDER — CEFAZOLIN SODIUM-DEXTROSE 2-3 GM-%(50ML) IV SOLR
INTRAVENOUS | Status: DC | PRN
  Administered 2022-10-29: 2 g via INTRAVENOUS

## 2022-10-29 MED ORDER — LACTATED RINGERS IV SOLN: INTRAVENOUS | Status: DC

## 2022-10-29 MED ORDER — DIPHENHYDRAMINE HCL 12.5 MG/5ML PO ELIX: 12.5000 mg | ORAL_SOLUTION | ORAL | Status: DC | PRN

## 2022-10-29 MED ORDER — CEFAZOLIN SODIUM 1 G IJ SOLR
INTRAMUSCULAR | Status: AC
  Filled 2022-10-29: qty 20

## 2022-10-29 MED ORDER — ENOXAPARIN SODIUM 40 MG/0.4ML IJ SOSY
40.0000 mg | PREFILLED_SYRINGE | Freq: Every evening | INTRAMUSCULAR | Status: DC
  Filled 2022-10-29: qty 0.4

## 2022-10-29 MED ORDER — KETOROLAC TROMETHAMINE 15 MG/ML IJ SOLN
15.0000 mg | Freq: Four times a day (QID) | INTRAMUSCULAR | Status: DC
  Administered 2022-10-29 – 2022-10-30 (×2): 15 mg via INTRAVENOUS
  Filled 2022-10-29 (×2): qty 1

## 2022-10-29 MED ORDER — OXYCODONE HCL 5 MG PO TABS
ORAL_TABLET | ORAL | Status: AC
  Administered 2022-10-29: 5 mg via ORAL
  Filled 2022-10-29: qty 1

## 2022-10-29 MED ORDER — METHOCARBAMOL 1000 MG/10ML IJ SOLN
500.0000 mg | Freq: Four times a day (QID) | INTRAVENOUS | Status: DC | PRN
  Filled 2022-10-29: qty 5

## 2022-10-29 MED ORDER — FENTANYL CITRATE (PF) 100 MCG/2ML IJ SOLN
INTRAMUSCULAR | Status: AC
  Filled 2022-10-29: qty 2

## 2022-10-29 MED ORDER — METHOCARBAMOL 500 MG PO TABS: 500.0000 mg | ORAL_TABLET | Freq: Four times a day (QID) | ORAL | Status: DC | PRN

## 2022-10-29 MED ORDER — MIDAZOLAM HCL 2 MG/2ML IJ SOLN
INTRAMUSCULAR | Status: AC
  Filled 2022-10-29: qty 2

## 2022-10-29 MED ORDER — PROPOFOL 10 MG/ML IV BOLUS
INTRAVENOUS | Status: DC | PRN
  Administered 2022-10-29: 150 mg via INTRAVENOUS
  Administered 2022-10-29: 50 mg via INTRAVENOUS

## 2022-10-29 MED ORDER — MIDAZOLAM HCL 2 MG/2ML IJ SOLN
INTRAMUSCULAR | Status: DC | PRN
  Administered 2022-10-29: 2 mg via INTRAVENOUS

## 2022-10-29 MED ORDER — SODIUM CHLORIDE 0.9 % IV SOLN: INTRAVENOUS | Status: DC

## 2022-10-29 MED ORDER — KETOROLAC TROMETHAMINE 30 MG/ML IJ SOLN
INTRAMUSCULAR | Status: AC
  Filled 2022-10-29: qty 1

## 2022-10-29 MED ORDER — LIDOCAINE HCL (CARDIAC) PF 100 MG/5ML IV SOSY
PREFILLED_SYRINGE | INTRAVENOUS | Status: DC | PRN
  Administered 2022-10-29: 100 mg via INTRAVENOUS

## 2022-10-29 MED ORDER — PROPOFOL 10 MG/ML IV BOLUS
INTRAVENOUS | Status: AC
  Filled 2022-10-29: qty 20

## 2022-10-29 MED ORDER — OXYCODONE HCL 5 MG PO TABS: 5.0000 mg | ORAL_TABLET | ORAL | Status: DC | PRN

## 2022-10-29 MED ORDER — DEXAMETHASONE SODIUM PHOSPHATE 10 MG/ML IJ SOLN
INTRAMUSCULAR | Status: AC
  Filled 2022-10-29: qty 1

## 2022-10-29 MED ORDER — FENTANYL CITRATE (PF) 100 MCG/2ML IJ SOLN
INTRAMUSCULAR | Status: DC | PRN
  Administered 2022-10-29 (×2): 25 ug via INTRAVENOUS
  Administered 2022-10-29: 50 ug via INTRAVENOUS

## 2022-10-29 MED ORDER — ORAL CARE MOUTH RINSE: 15.0000 mL | Freq: Once | OROMUCOSAL | Status: AC

## 2022-10-29 MED ORDER — ASPIRIN 81 MG PO CHEW: 324.0000 mg | CHEWABLE_TABLET | Freq: Every day | ORAL | Status: DC

## 2022-10-29 MED ORDER — ONDANSETRON HCL 4 MG/2ML IJ SOLN
INTRAMUSCULAR | Status: AC
  Filled 2022-10-29: qty 2

## 2022-10-29 MED ORDER — ONDANSETRON HCL 4 MG/2ML IJ SOLN
INTRAMUSCULAR | Status: DC | PRN
  Administered 2022-10-29: 4 mg via INTRAVENOUS

## 2022-10-29 MED ORDER — SENNOSIDES-DOCUSATE SODIUM 8.6-50 MG PO TABS: 1.0000 | ORAL_TABLET | Freq: Every evening | ORAL | Status: DC | PRN

## 2022-10-29 MED ORDER — ACETAMINOPHEN 500 MG PO TABS
1000.0000 mg | ORAL_TABLET | Freq: Three times a day (TID) | ORAL | Status: DC
  Filled 2022-10-29: qty 2

## 2022-10-29 MED ORDER — CHLORHEXIDINE GLUCONATE 0.12 % MT SOLN: 15.0000 mL | Freq: Once | OROMUCOSAL | Status: AC

## 2022-10-29 MED ORDER — GLYCOPYRROLATE 0.2 MG/ML IJ SOLN
INTRAMUSCULAR | Status: DC | PRN
  Administered 2022-10-29: .2 mg via INTRAVENOUS

## 2022-10-29 MED ORDER — OXYCODONE HCL 5 MG PO TABS
10.0000 mg | ORAL_TABLET | ORAL | Status: DC | PRN
  Administered 2022-10-29 – 2022-10-30 (×2): 10 mg via ORAL
  Filled 2022-10-29 (×2): qty 2

## 2022-10-29 MED ORDER — SODIUM CHLORIDE (PF) 0.9 % IJ SOLN
INTRAMUSCULAR | Status: AC
  Filled 2022-10-29: qty 20

## 2022-10-29 MED ORDER — CHLORHEXIDINE GLUCONATE 0.12 % MT SOLN
OROMUCOSAL | Status: AC
  Administered 2022-10-29: 15 mL via OROMUCOSAL
  Filled 2022-10-29: qty 15

## 2022-10-29 MED ORDER — KETOROLAC TROMETHAMINE 30 MG/ML IJ SOLN
INTRAMUSCULAR | Status: DC | PRN
  Administered 2022-10-29: 30 mg via INTRAVENOUS

## 2022-10-29 MED ORDER — OXYCODONE HCL 5 MG/5ML PO SOLN: 5.0000 mg | Freq: Once | ORAL | Status: AC | PRN

## 2022-10-29 MED ORDER — DOCUSATE SODIUM 100 MG PO CAPS
100.0000 mg | ORAL_CAPSULE | Freq: Two times a day (BID) | ORAL | Status: DC
  Filled 2022-10-29: qty 1

## 2022-10-29 MED ORDER — EPHEDRINE SULFATE (PRESSORS) 50 MG/ML IJ SOLN
INTRAMUSCULAR | Status: DC | PRN
  Administered 2022-10-29 (×2): 10 mg via INTRAVENOUS

## 2022-10-29 MED ORDER — FENTANYL CITRATE (PF) 100 MCG/2ML IJ SOLN: 25.0000 ug | INTRAMUSCULAR | Status: DC | PRN

## 2022-10-29 MED ORDER — HYDROMORPHONE HCL 1 MG/ML IJ SOLN: 0.2000 mg | INTRAMUSCULAR | Status: DC | PRN

## 2022-10-29 MED ORDER — DEXMEDETOMIDINE HCL IN NACL 80 MCG/20ML IV SOLN
INTRAVENOUS | Status: DC | PRN
  Administered 2022-10-29: 20 ug via INTRAVENOUS

## 2022-10-29 MED ORDER — OXYCODONE HCL 5 MG PO TABS: 5.0000 mg | ORAL_TABLET | Freq: Once | ORAL | Status: AC | PRN

## 2022-10-29 SURGICAL SUPPLY — 22 items
APPLICATOR CHLORAPREP 10.5 ORG (MISCELLANEOUS) ×1 IMPLANT
DRAIN PENROSE 12X.25 LTX STRL (MISCELLANEOUS) IMPLANT
ELECT REM PT RETURN 9FT ADLT (ELECTROSURGICAL) ×1
ELECTRODE REM PT RTRN 9FT ADLT (ELECTROSURGICAL) ×1 IMPLANT
GAUZE SPONGE 4X4 12PLY STRL (GAUZE/BANDAGES/DRESSINGS) ×1 IMPLANT
GAUZE XEROFORM 1X8 LF (GAUZE/BANDAGES/DRESSINGS) IMPLANT
GLOVE BIOGEL PI IND STRL 8 (GLOVE) ×1 IMPLANT
GLOVE SURG SYN 7.5  E (GLOVE) ×1
GLOVE SURG SYN 7.5 E (GLOVE) ×1 IMPLANT
GLOVE SURG SYN 7.5 PF PI (GLOVE) ×1 IMPLANT
GOWN STRL REUS W/ TWL LRG LVL3 (GOWN DISPOSABLE) ×1 IMPLANT
GOWN STRL REUS W/ TWL XL LVL3 (GOWN DISPOSABLE) ×1 IMPLANT
GOWN STRL REUS W/TWL LRG LVL3 (GOWN DISPOSABLE) ×1
GOWN STRL REUS W/TWL XL LVL3 (GOWN DISPOSABLE) ×1
KIT TURNOVER KIT A (KITS) ×1 IMPLANT
MANIFOLD NEPTUNE II (INSTRUMENTS) ×1 IMPLANT
NS IRRIG 500ML POUR BTL (IV SOLUTION) ×1 IMPLANT
PACK EXTREMITY ARMC (MISCELLANEOUS) ×1 IMPLANT
PADDING CAST BLEND 4X4 STRL (MISCELLANEOUS) IMPLANT
SWAB CULTURE AMIES ANAERIB BLU (MISCELLANEOUS) ×2 IMPLANT
TRAP FLUID SMOKE EVACUATOR (MISCELLANEOUS) ×1 IMPLANT
WATER STERILE IRR 500ML POUR (IV SOLUTION) ×1 IMPLANT

## 2022-10-29 NOTE — H&P (Signed)
H&P reviewed. No significant changes noted.  

## 2022-10-29 NOTE — Anesthesia Preprocedure Evaluation (Signed)
Anesthesia Evaluation  Patient identified by MRN, date of birth, ID band Patient awake    Reviewed: Allergy & Precautions, NPO status , Patient's Chart, lab work & pertinent test results  History of Anesthesia Complications Negative for: history of anesthetic complications  Airway Mallampati: III  TM Distance: >3 FB Neck ROM: full    Dental  (+) Chipped, Poor Dentition   Pulmonary neg pulmonary ROS, Current Smoker and Patient abstained from smoking.   Pulmonary exam normal        Cardiovascular Exercise Tolerance: Good (-) angina (-) Past MI negative cardio ROS Normal cardiovascular exam     Neuro/Psych negative neurological ROS  negative psych ROS   GI/Hepatic negative GI ROS, Neg liver ROS,neg GERD  ,,  Endo/Other  negative endocrine ROS    Renal/GU      Musculoskeletal   Abdominal   Peds  Hematology negative hematology ROS (+)   Anesthesia Other Findings History reviewed. No pertinent past medical history.  History reviewed. No pertinent surgical history.  BMI    Body Mass Index: 22.60 kg/m      Reproductive/Obstetrics negative OB ROS                             Anesthesia Physical Anesthesia Plan  ASA: 2  Anesthesia Plan: General LMA   Post-op Pain Management:    Induction: Intravenous  PONV Risk Score and Plan: Dexamethasone, Ondansetron, Midazolam and Treatment may vary due to age or medical condition  Airway Management Planned: LMA  Additional Equipment:   Intra-op Plan:   Post-operative Plan: Extubation in OR  Informed Consent: I have reviewed the patients History and Physical, chart, labs and discussed the procedure including the risks, benefits and alternatives for the proposed anesthesia with the patient or authorized representative who has indicated his/her understanding and acceptance.     Dental Advisory Given  Plan Discussed with:  Anesthesiologist, CRNA and Surgeon  Anesthesia Plan Comments: (Patient consented for risks of anesthesia including but not limited to:  - adverse reactions to medications - damage to eyes, teeth, lips or other oral mucosa - nerve damage due to positioning  - sore throat or hoarseness - Damage to heart, brain, nerves, lungs, other parts of body or loss of life  Patient voiced understanding.)       Anesthesia Quick Evaluation

## 2022-10-29 NOTE — Consult Note (Signed)
ORTHOPAEDIC CONSULTATION  REQUESTING PHYSICIAN: Val Riles, MD  Chief Complaint:   L knee pain  History of Present Illness: Cody Mercado is a 52 y.o. 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  He had significant pain and swelling which worsened over the course of the next few days.  He was prescribed antibiotics on 10/25/2022 and was given a dose of Rocephin in the emergency department, but signed out AMA due to not being able to stay overnight due to other commitments.  He presented to the emergency department again yesterday.  He underwent an I&D of the prepatellar bursa By the ED staff, Dr. Jacelyn Grip.  Purulent drainage was noted and the wound was packed with iodoform gauze.  An MRI was obtained afterwards.  This showed persistent abscess collection inferior to the region of the I&D site.  He was admitted to the hospital and I was consulted for further management.    Of note, the patient has opioid use as a history of opiate use.  He lives alone.  History reviewed. No pertinent past medical history. History reviewed. No pertinent surgical history. Social History   Socioeconomic History   Marital status: Divorced    Spouse name: Not on file   Number of children: Not on file   Years of education: Not on file   Highest education level: Not on file  Occupational History   Not on file  Tobacco Use   Smoking status: Every Day    Types: Cigarettes   Smokeless tobacco: Never  Substance and Sexual Activity   Alcohol use: Yes    Comment: occasionally   Drug use: Yes    Comment: heroin, fentanyl, oxycodone   Sexual activity: Never  Other Topics Concern   Not on file  Social History Narrative   Not on file   Social Determinants of Health   Financial Resource Strain: Not on file  Food Insecurity: No Food Insecurity (10/28/2022)   Hunger Vital Sign     Worried About Running Out of Food in the Last Year: Never true    Ran Out of Food in the Last Year: Never true  Transportation Needs: No Transportation Needs (10/28/2022)   PRAPARE - Hydrologist (Medical): No    Lack of Transportation (Non-Medical): No  Physical Activity: Not on file  Stress: Not on file  Social Connections: Not on file   History reviewed. No pertinent family history. Allergies  Allergen Reactions   Tramadol Nausea And Vomiting   Prior to Admission medications   Medication Sig Start Date End Date Taking? Authorizing Provider  cephALEXin (KEFLEX) 500 MG capsule Take 1 capsule (500 mg total) by mouth 4 (four) times daily for 10 days. 10/25/22 11/04/22 Yes Poggi, Clarnce Flock, PA-C  doxycycline (ADOXA) 100 MG tablet Take 1 tablet (100 mg total) by mouth 2 (two) times daily for 10 days. 10/25/22 11/04/22 Yes PoggiClarnce Flock, PA-C   Recent Labs    10/28/22 [id number] 10/28/22 2016 10/29/22 0424  WBC 14.8* 12.8* 11.3*  HGB 14.1 13.4 12.5*  HCT 43.7 40.6 37.6*  PLT 300 291 264  K 4.1 4.2 4.0  CL 100 100 102  CO2 29 29 26   BUN 10 9 8   CREATININE 0.63 0.71 0.53*  GLUCOSE 107* 98 96  CALCIUM 8.4* 8.2* 7.9*   MR KNEE LEFT W WO CONTRAST  Result Date: 10/29/2022 CLINICAL DATA:  Knee  pain and swelling. EXAM: MRI OF THE LEFT KNEE WITHOUT AND WITH CONTRAST TECHNIQUE: Multiplanar, multisequence MR imaging of the left knee was performed both before and after administration of intravenous contrast. CONTRAST:  30mL GADAVIST GADOBUTROL 1 MMOL/ML IV SOLN COMPARISON:  Radiographs, same date. FINDINGS: There is an open wound noted on the dorsal aspect of the knee overlying the patella. Associated septic prepatellar bursitis and significant surrounding inflammation/edema/cellulitis surrounding the entire knee. There is a small knee joint effusion but I do not see any significant synovial enhancement, destructive cartilage changes or other findings suspicious for septic  arthritis. There is patchy abnormal T1 and T2 signal intensity in the metadiaphyseal region of the femur which could be reactive change. After appropriate treatment for the septic prepatellar bursitis/abscess repeat imaging may be helpful if symptoms persist. No findings for internal derangement. No meniscal tears. The knee ligaments are intact and the quadriceps and patellar tendons are intact. IMPRESSION: 1. Open wound on the dorsal aspect of the knee overlying the patella. Associated septic prepatellar bursitis/abscess and significant surrounding inflammation/edema/cellulitis surrounding the entire knee. 2. Small knee joint effusion but I do not see any significant synovial enhancement, destructive cartilage changes or other findings suspicious for septic arthritis. 3. Patchy abnormal T1 and T2 signal intensity in the metadiaphyseal region of the femur could be reactive change or residual red marrow. After appropriate treatment for the septic prepatellar bursitis/abscess repeat imaging may be helpful if symptoms persist. 4. No findings for internal derangement. Electronically Signed   By: Marijo Sanes M.D.   On: 10/29/2022 07:12   DG Knee 2 Views Left  Result Date: 10/28/2022 CLINICAL DATA:  Left leg cellulitis. EXAM: LEFT KNEE - 1-2 VIEW COMPARISON:  10/25/2022 FINDINGS: Mild osteoarthritic change of the left knee. Bony structures otherwise unremarkable without evidence of osteolysis to suggest infection. Moderate soft tissue swelling over the anterior soft tissues of the knee with suggestion of minimal mottled air within the prominent anterior soft tissues. Findings are compatible patient's known cellulitis. IMPRESSION: Moderate soft tissue swelling over the anterior soft tissues of the knee with suggestion of minimal mottled subcutaneous air. Findings compatible patient's known cellulitis with associated soft tissue air which could be seen due to intervention versus gas-forming organism. Electronically  Signed   By: Marin Olp M.D.   On: 10/28/2022 13:12     Positive ROS: All other systems have been reviewed and were otherwise negative with the exception of those mentioned in the HPI and as above.  Physical Exam: BP 113/72   Pulse 60   Temp 98.2 F (36.8 C) (Oral)   Resp 18   Ht 6\' 3"  (1.905 m)   Wt 82 kg   SpO2 97%   BMI 22.60 kg/m  General:  Alert, no acute distress Psychiatric:  Patient is competent for consent with normal mood and affect    Orthopedic Exam:  LLE: 5/5 DF/PF/EHL SILT s/s/t/sp/dp distr Foot wwp RoM knee: 0-1 20 without significant pain Significant tenderness to palpation and mild fluctuance in the region of the patellar tendon.  Significant erythema about this region as well.  There is mild drainage from the region of the open wound and packing.  Imaging:  As above: Nonweightbearing radiographs do not show any significant degenerative changes to the knee joint.  No signs of osteomyelitis.  MRI of the knee shows a persistent abscess collection just anterior to the patellar tendon in the region of the prepatellar bursa.  This region is inferior to the I&D site.  Assessment/Plan: 52 year old male with infected left prepatellar bursa with residual abscess despite irrigation debridement in the emergency department. 1.  Given the clinical and imaging findings, we discussed the treatment options. After discussion of risks, benefits, and alternatives to surgery, the patient elected to proceed with left prepatellar bursa irrigation and debridement.  2.  Keep n.p.o. until OR  3.  Will plan to follow-up cultures.     Leim Fabry   10/29/2022 2:00 PM

## 2022-10-29 NOTE — Consult Note (Signed)
Pharmacy Antibiotic Note  Cody Mercado is a 52 y.o. male with a medical history significant of substance abuse disorder, admitted on 10/28/2022 with wound infection with possible septic arthritis. Patient reports that he knelt on a nail that broke the skin at work 3/11. Last tetanus shot in 2021. He presented to the ED 3/14 for with approximately 1 week of knee redness, swelling, and pain with limited ability to bear weight. He left AMA after one dose of ceftriaxone and prescriptions for Keflex and doxycycline were sent to home pharmacy. Patient reports that his leg has gotten worse while taking the oral antibiotics. Abscess of L knee was drained by EDP 3/15. Pharmacy has been consulted for vancomycin dosing.   Relevant labs: Scr: 0.73 --0.53, WBC: 14.2 -- 11.3, currently afebrile   Received vancomycin 1750 mg IV x 1 in ED @ 1313 10/27/21.   Plan: Day 2 of antibiotics Given vancomycin 1250 mg IV Q12H @ 0003 10/28/21. Increase to vancomycin 1750 mg IV Q12H.  Goal AUC: 400-550 Expected AUC: 529.7, Css min: 13.1 Scr: 0.8 (actual 0.53), Wt: TBW ,Vd: 0.72 (BMI 22.6) Continue cefepime 2 g IV Q8H. Continue to monitor renal function and follow blood culture results.  Height: 6\' 3"  (190.5 cm) Weight: 82 kg (180 lb 12.4 oz) IBW/kg (Calculated) : 84.5  Temp (24hrs), Avg:98.2 F (36.8 C), Min:98 F (36.7 C), Max:98.5 F (36.9 C)  Recent Labs  Lab 10/25/22 0957 10/28/22 0906 10/28/22 1101 10/28/22 2016 10/29/22 0424  WBC 14.2* 14.8*  --  12.8* 11.3*  CREATININE 0.73 0.63  --  0.71 0.53*  LATICACIDVEN 1.9 1.1 1.3  --   --      Estimated Creatinine Clearance: 129.5 mL/min (A) (by C-G formula based on SCr of 0.53 mg/dL (L)).    Allergies  Allergen Reactions   Tramadol Nausea And Vomiting    Antimicrobials this admission: 3/17 Vancomycin >>  3/17 Cefepime >>   Dose adjustments this admission:   Microbiology results: 3/17 BCx: IP  Thank you for allowing pharmacy to be a part of  this patient's care.  Si Raider, PharmD Candidate Class of 2026 10/29/2022 8:29 AM

## 2022-10-29 NOTE — Progress Notes (Signed)
[facility] Progress Note  Patient: Cody Mercado    KXF:[id number]  DOA: 10/28/2022     Date of Service: the patient was seen and examined on 10/29/2022  Chief Complaint  Patient presents with   Wound Infection   Brief hospital course: Cody Mercado is a 52 y.o. male with medical history significant of opioid use disorder, who presents to the ED due to left knee wound infection.  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  Within 24 hours he developed severe pain and swelling which is getting worse.  Patient also felt hot and cold chills, nausea and significant pain in the left knee.  Patient is taking antibiotics prescribed on 3/14. Per chart review, patient was seen in the ED on 10/25/2022 for the same issue.  At the time, he was given a dose of Rocephin but patient signed out AMA.  Prior to discharge, Keflex and doxycycline were sent into the pharmacy.  ED workup: Vitals blood pressure soft, sodium 134, calcium 8.2, 3.0, AST 53, WBC 12.8, rest of the labs within normal range Left knee x-ray: Moderate soft tissue swelling over the anterior soft tissues of the knee with suggestion of minimal mottled subcutaneous air. Findings compatible patient's known cellulitis with associated soft tissue air which could be seen due to intervention versus gas-forming organism.    Assessment and Plan:  Left knee cellulitis and prepatellar abscess/bursitis Abscess was noted by EDP on ultrasound with drainage performed; unfortunately purulent drainage was not sent for culture.  MRI left Knee; Open wound on the dorsal aspect of the knee overlying the patella. Associated septic prepatellar bursitis/abscess and significant surrounding inflammation/edema/cellulitis surrounding the entire knee. 2. Small knee joint effusion but I do not see any significant synovial enhancement, destructive cartilage  changes or other findings suspicious for septic arthritis. 3. Patchy abnormal T1 and T2 signal intensity in the metadiaphyseal region of the femur could be reactive change or residual red marrow. After appropriate treatment for the septic prepatellar bursitis/abscess repeat imaging may be helpful if symptoms persist. 4. No findings for internal derangement. --Continue cefepime and vancomycin, pharmacy consulted for dosing and trough monitoring. S/p I&D was done by ED physician Orthopedic consulted for possible surgical washout, we will follow surgical cultures Consulted ID for recommendation of antibiotics Follow blood cultures  UDS positive for cocaine, opiates and benzo Drug abuse abstinence counseling done. Patient denies any substance abuse at this time, but per chart review, he has a history of IV substance use dating back into 2017   Hep C antibody positive Follow hep C RNA titers   Body mass index is 22.6 kg/m.  Interventions:       Diet: NPO for possible washout by orthopedic surgery DVT Prophylaxis: Subcutaneous Lovenox   Advance goals of care discussion: Full code  Family Communication: family was not present at bedside, at the time of interview.  The pt provided permission to discuss medical plan with the family. Opportunity was given to ask question and all questions were answered satisfactorily.   Disposition:  Pt is from Home, admitted with left knee infection, still has inflammation on IV antibiotics, orthopedic consulted for washout, which precludes a safe discharge. Discharge to home, when medically stable, may need few days to improve.  Subjective: No significant events overnight, patient still complaining of pain in the left knee 6/10,  some drainage noticed after removing the bandage.  Patient denies any chest pain or palpitation, no shortness of breath, no any other active issues.  Physical Exam: General: NAD, lying comfortably Appear in no distress, affect  appropriate Eyes: PERRLA ENT: Oral Mucosa Clear, moist  Neck: no JVD,  Cardiovascular: S1 and S2 Present, no Murmur,  Respiratory: good respiratory effort, Bilateral Air entry equal and Decreased, no Crackles, no wheezes Abdomen: Bowel Sound present, Soft and no tenderness,  Skin: no rashes Extremities: no Pedal edema, no calf tenderness.  Left knee erythema and tenderness, cellulitis. Neurologic: without any new focal findings Gait not checked due to patient safety concerns  Vitals:   10/28/22 1525 10/28/22 1544 10/28/22 2358 10/29/22 0810  BP: 110/62 117/62 (!) 100/55 106/64  Pulse: 60 64 (!) 57 (!) 58  Resp: 16 18 16 17   Temp: 98 F (36.7 C) 98.5 F (36.9 C) 98.3 F (36.8 C) 98.5 F (36.9 C)  TempSrc:  Oral    SpO2: 98% 100% 97% 100%  Weight:  82 kg    Height:  6\' 3"  (1.905 m)      Intake/Output Summary (Last 24 hours) at 10/29/2022 1307 Last data filed at 10/29/2022 0744 Gross per 24 hour  Intake 420.32 ml  Output 760 ml  Net -339.68 ml   Filed Weights   10/28/22 0901 10/28/22 1544  Weight: 82 kg 82 kg    Data Reviewed: I have personally reviewed and interpreted daily labs, tele strips, imagings as discussed above. I reviewed all nursing notes, pharmacy notes, vitals, pertinent old records I have discussed plan of care as described above with RN and patient/family.  CBC: Recent Labs  Lab 10/25/22 0957 10/28/22 0906 10/28/22 2016 10/29/22 0424  WBC 14.2* 14.8* 12.8* 11.3*  NEUTROABS 10.0* 10.3*  --   --   HGB 13.2 14.1 13.4 12.5*  HCT 40.6 43.7 40.6 37.6*  MCV 95.3 95.0 93.5 93.5  PLT 216 300 291 [id number]   Basic Metabolic Panel: Recent Labs  Lab 10/25/22 0957 10/28/22 0906 10/28/22 2016 10/29/22 0424  NA 134* 133* 134* 134*  K 3.9 4.1 4.2 4.0  CL 99 100 100 102  CO2 30 29 29 26   GLUCOSE 96 107* 98 96  BUN 11 10 9 8   CREATININE 0.73 0.63 0.71 0.53*  CALCIUM 8.7* 8.4* 8.2* 7.9*    Studies: MR KNEE LEFT W WO CONTRAST  Result Date:  10/29/2022 CLINICAL DATA:  Knee pain and swelling. EXAM: MRI OF THE LEFT KNEE WITHOUT AND WITH CONTRAST TECHNIQUE: Multiplanar, multisequence MR imaging of the left knee was performed both before and after administration of intravenous contrast. CONTRAST:  94mL GADAVIST GADOBUTROL 1 MMOL/ML IV SOLN COMPARISON:  Radiographs, same date. FINDINGS: There is an open wound noted on the dorsal aspect of the knee overlying the patella. Associated septic prepatellar bursitis and significant surrounding inflammation/edema/cellulitis surrounding the entire knee. There is a small knee joint effusion but I do not see any significant synovial enhancement, destructive cartilage changes or other findings suspicious for septic arthritis. There is patchy abnormal T1 and T2 signal intensity in the metadiaphyseal region of the femur which could be reactive change. After appropriate treatment for the septic prepatellar bursitis/abscess repeat imaging may be helpful if symptoms persist. No findings for internal derangement. No meniscal tears. The knee ligaments are intact and the quadriceps and patellar tendons are intact. IMPRESSION: 1. Open wound on the dorsal aspect of the knee overlying the patella. Associated septic prepatellar bursitis/abscess and significant  surrounding inflammation/edema/cellulitis surrounding the entire knee. 2. Small knee joint effusion but I do not see any significant synovial enhancement, destructive cartilage changes or other findings suspicious for septic arthritis. 3. Patchy abnormal T1 and T2 signal intensity in the metadiaphyseal region of the femur could be reactive change or residual red marrow. After appropriate treatment for the septic prepatellar bursitis/abscess repeat imaging may be helpful if symptoms persist. 4. No findings for internal derangement. Electronically Signed   By: Marijo Sanes M.D.   On: 10/29/2022 07:12    Scheduled Meds:  sodium chloride flush  3 mL Intravenous Q12H    Continuous Infusions:  ceFEPime (MAXIPIME) IV 2 g (10/29/22 1109)   vancomycin 1,250 mg (10/29/22 0924)   PRN Meds: acetaminophen **OR** acetaminophen, HYDROcodone-acetaminophen, HYDROmorphone (DILAUDID) injection, ondansetron **OR** ondansetron (ZOFRAN) IV, polyethylene glycol  Time spent: 55 minutes  Author: Val Riles. MD [facility] 10/29/2022 1:07 PM  To reach On-call, see care teams to locate the attending and reach out to them via [email or web address]. If 7PM-7AM, please contact night-coverage If you still have difficulty reaching the attending provider, please page the [facility] (Director on Call) for [facility] on amion for assistance.

## 2022-10-29 NOTE — Op Note (Signed)
Operative Note    SURGERY DATE: 10/29/2022   PRE-OP DIAGNOSIS:  1. Left prepatellar bursa infection   POST-OP DIAGNOSIS:  1. Left prepatellar bursa infection   PROCEDURES:  1. Left knee incision and drainage (prepatellar bursa) 2. Left prepatellar bursa irrigation and debridement   SURGEON: Cato Mulligan, MD  ASSISTANTS: none   ANESTHESIA: Gen    ESTIMATED BLOOD LOSS: 25cc   TOTAL IV FLUIDS: per anesthesia  DRAIN: none   INDICATION(S):  JOVON LETTER is a 52 y.o. male who was working and had a nail get embedded within his prepatellar bursa.  He presents to the emergency department shortly afterwards and received a course of antibiotics.  He left AMA due, but Tama Gander presented yesterday.  He underwent irrigation and debridement in the emergency department, but MRI afterwards showed persistent abscess collection. After discussion of risks, benefits, and alternatives to surgery, the patient elected to proceed.    OPERATIVE FINDINGS: Purulence present   OPERATIVE REPORT:   I identified ABDIAS AREY in the pre-operative holding area. Informed consent was obtained and the surgical site was marked. I reviewed the risks and benefits of the proposed surgical intervention and the patient wished to proceed. The patient was transferred to the operative suite and general anesthesia was administered. The patient was placed in the supine position. All down side pressure points were appropriately padded. The extremity was then prepped and draped in standard fashion. A time out was performed confirming the correct extremity, correct patient, and correct procedure. The patient had already been on antibiotics since admission.  Packing material was removed from the wound.  Upon palpation, there was notable purulent material that was able to be expressed from the wound. This was cultured and 3 samples were sent.  The incision was extended distally overlying the superior aspect of the patellar tendon.  All  purulent material was removed.  A curette was utilized to perform an excisional debridement of the underlying tissue.  A rongeur was used to remove loose and nonviable tissue as well.  Skin edges from the prior I&D site were freshened.  The wound was thoroughly irrigated.  The wound was probed extensively, and there was no definitive communication within the knee joint itself.  Hemostasis was achieved using Bovie electrocautery.  2 Penrose drains were placed.  The wound was then closed with 2-0 PDS in the subdermal layer followed by 3-0 nylon sutures in a horizontal mattress fashion for the skin.  Xeroform and sterile soft dressing was applied. Patient was extubated, transferred to a stretcher bed and to the post antesthesia care unit in stable condition.   POSTOPERATIVE PLAN: The patient will stay overnight for continued IV antibiotics with plan for discharge home on oral antibiotics after cultures/sensitivities are obtained.  Aspirin 325 mg/day for DVT prophylaxis.  Follow-up as outpatient in approximately 1 week for wound reevaluation.

## 2022-10-29 NOTE — Transfer of Care (Signed)
Immediate Anesthesia Transfer of Care Note  Patient: Cody Mercado  Procedure(s) Performed: Procedure(s): INCISION AND DRAINAGE Left Knee (Left)  Patient Location: PACU  Anesthesia Type:General  Level of Consciousness: sedated  Airway & Oxygen Therapy: Patient Spontanous Breathing and Patient connected to face mask oxygen  Post-op Assessment: Report given to RN and Post -op Vital signs reviewed and stable  Post vital signs: Reviewed and stable  Last Vitals:  Vitals:   10/29/22 1340 10/29/22 1534  BP: 113/72 117/70  Pulse: 60 (!) 59  Resp: 18 18  Temp: 36.8 C   SpO2: 0000000 [id number]    Complications: No apparent anesthesia complications

## 2022-10-29 NOTE — Anesthesia Procedure Notes (Signed)
Procedure Name: LMA Insertion Date/Time: 10/29/2022 2:41 PM  Performed by: Doreen Salvage, CRNAPre-anesthesia Checklist: Patient identified, Patient being monitored, Timeout performed, Emergency Drugs available and Suction available Patient Re-evaluated:Patient Re-evaluated prior to induction Oxygen Delivery Method: Circle system utilized Preoxygenation: Pre-oxygenation with 100% oxygen Induction Type: IV induction Ventilation: Mask ventilation without difficulty LMA: LMA inserted LMA Size: 4.0 Tube type: Oral Number of attempts: 1 Placement Confirmation: positive ETCO2 and breath sounds checked- equal and bilateral Tube secured with: Tape Dental Injury: Teeth and Oropharynx as per pre-operative assessment

## 2022-10-30 ENCOUNTER — Encounter: Payer: Self-pay | Admitting: Orthopedic Surgery

## 2022-10-30 LAB — CULTURE, BLOOD (ROUTINE X 2)
Culture: NO GROWTH
Culture: NO GROWTH
Special Requests: ADEQUATE
Special Requests: ADEQUATE

## 2022-10-30 MED ORDER — VANCOMYCIN HCL 1750 MG/350ML IV SOLN: 1750.0000 mg | Freq: Two times a day (BID) | INTRAVENOUS | Status: DC

## 2022-10-30 NOTE — Progress Notes (Signed)
         NAME: Cody Mercado MRN: [id number] DOB : [birth year] ATTENDING PHYSICIAN: Val Riles, MD                                                                                       Against Medical Advice  Patient at this time expresses desire to leave the Hospital immediately, patient has been warned that this is not Medically advisable at this time, and can result in Medical complications. He Specifically discussed need for IV antibiotics and that he has left AMA before and came back to the ED after he did not improve. He states he feels better and he believes he has received enough antibiotics. Patient has full decision making capacity and understands and accepts the risks involved and assumes full responsibilty of this decision.  This patient has also been advised that if they feel the need for further medical assistance to return to the closest ER or dial [phone].   This document was prepared using Dragon voice recognition software and may include unintentional dictation errors.  Neomia Glass DNP, MBA, FNP-BC, PMHNP-BC Nurse Practitioner [facility] Pager [phone]

## 2022-10-30 NOTE — Progress Notes (Signed)
ID consult-  Went to see patient around 9am- he had left AMA this morning

## 2022-10-30 NOTE — Plan of Care (Signed)
°  Problem: Clinical Measurements: °Goal: Ability to maintain clinical measurements within normal limits will improve °Outcome: Progressing °  °Problem: Activity: °Goal: Risk for activity intolerance will decrease °Outcome: Progressing °  °Problem: Coping: °Goal: Level of anxiety will decrease °Outcome: Progressing °  °Problem: Pain Managment: °Goal: General experience of comfort will improve °Outcome: Progressing °  °Problem: Safety: °Goal: Ability to remain free from injury will improve °Outcome: Progressing °  °Problem: Skin Integrity: °Goal: Risk for impaired skin integrity will decrease °Outcome: Progressing °  °

## 2022-10-30 NOTE — Discharge Summary (Signed)
[facility] Discharge Summary                                    Left AMA ([place])  Patient: Cody Mercado [id number]  PCP: Patient, No Pcp Per  Date of admission: 10/28/2022   Date of discharge: 10/30/2022     Discharge Diagnoses:  Principal Problem:   Cellulitis and abscess of left lower extremity Active Problems:   Opioid use disorder   Admitted From: Home Disposition:  Left AMA ([place])  Recommendations for Outpatient Follow-up:  Patient left AMA Follow up LABS/TEST:     Diet recommendation: Regular diet  Activity: The patient is advised to gradually reintroduce usual activities, as tolerated  Discharge Condition: stable  Code Status: Full code   History of present illness: As per the H and P dictated on admission Hospital Course:  Cody Mercado is a 52 y.o. male with medical history significant of opioid use disorder, who presents to the ED due to left knee wound infection.  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  Within 24 hours he developed severe pain and swelling which is getting worse.  Patient also felt hot and cold chills, nausea and significant pain in the left knee.  Patient is taking antibiotics prescribed on 3/14. Per chart review, patient was seen in the ED on 10/25/2022 for the same issue.  At the time, he was given a dose of Rocephin but patient signed out AMA.  Prior to discharge, Keflex and doxycycline were sent into the pharmacy.  ED workup: Vitals blood pressure soft, sodium 134, calcium 8.2, 3.0, AST 53, WBC 12.8, rest of the labs within normal range Left knee x-ray: Moderate soft tissue swelling over the anterior soft tissues of the knee with suggestion of minimal mottled subcutaneous air. Findings compatible patient's known cellulitis with associated soft tissue air which could be seen due to  intervention versus gas-forming organism.  Assessment and Plan:   # Left knee cellulitis and prepatellar abscess/bursitis Abscess was noted by EDP on ultrasound with drainage performed; unfortunately purulent drainage was not sent for culture.  MRI left Knee; Open wound on the dorsal aspect of the knee overlying the patella. Associated septic prepatellar bursitis/abscess and significant surrounding inflammation/edema/cellulitis surrounding the entire knee. 2. Small knee joint effusion but I do not see any significant synovial enhancement, destructive cartilage changes or other findings suspicious for septic arthritis. 3. Patchy abnormal T1 and T2 signal intensity in the metadiaphyseal region of the femur could be reactive change or residual red marrow. After appropriate treatment for the septic prepatellar bursitis/abscess repeat imaging may be helpful if symptoms persist. 4. No findings for internal derangement. --Continue cefepime and vancomycin, pharmacy consulted for dosing and trough monitoring. S/p I&D was done by ED physician Orthopedic consulted, s/p 1. Left knee incision and drainage (prepatellar bursa) 2. Left prepatellar bursa irrigation and debridement Consulted ID for recommendation of antibiotics Follow blood cultures and surgical cultures   UDS positive for cocaine, opiates and benzo Drug abuse abstinence counseling done. Patient denies any substance abuse at this time, but per chart review, he has a history of IV substance use dating back into 2017    Hep C antibody positive Follow hep C RNA titers  Body mass index is 22.6 kg/m.  Nutrition Interventions:    I noticed in the morning that patient left AMA before my shift.  I was not able to see him.  Consultants: Orthopedic surgery and ID Procedures: Left prepatellar bursa infection.  Left knee incision and drainage (prepatellar bursa) 2. Left prepatellar bursa irrigation and debridement  Discharge Exam: I was  unable to see the patient, patient left AMA, early morning before the start of my shift.   Filed Weights   10/28/22 0901 10/28/22 1544 10/29/22 1340  Weight: 82 kg 82 kg 82 kg   Vitals:   10/29/22 1737 10/30/22 0023  BP: 104/66 (!) 106/56  Pulse: 66 (!) 59  Resp: 17 20  Temp: 97.9 F (36.6 C) 98.3 F (36.8 C)  SpO2: 100% 99%    DISCHARGE MEDICATION:  Left AMA (AGAINST MEDICAL ADVICE)   Allergies as of 10/30/2022       Reactions   Tramadol Nausea And Vomiting        Medication List     ASK your doctor about these medications        Allergies  Allergen Reactions   Tramadol Nausea And Vomiting     The results of significant diagnostics from this hospitalization (including imaging, microbiology, ancillary and laboratory) are listed below for reference.    Significant Diagnostic Studies: MR KNEE LEFT W WO CONTRAST  Result Date: 10/29/2022 CLINICAL DATA:  Knee pain and swelling. EXAM: MRI OF THE LEFT KNEE WITHOUT AND WITH CONTRAST TECHNIQUE: Multiplanar, multisequence MR imaging of the left knee was performed both before and after administration of intravenous contrast. CONTRAST:  27mL GADAVIST GADOBUTROL 1 MMOL/ML IV SOLN COMPARISON:  Radiographs, same date. FINDINGS: There is an open wound noted on the dorsal aspect of the knee overlying the patella. Associated septic prepatellar bursitis and significant surrounding inflammation/edema/cellulitis surrounding the entire knee. There is a small knee joint effusion but I do not see any significant synovial enhancement, destructive cartilage changes or other findings suspicious for septic arthritis. There is patchy abnormal T1 and T2 signal intensity in the metadiaphyseal region of the femur which could be reactive change. After appropriate treatment for the septic prepatellar bursitis/abscess repeat imaging may be helpful if symptoms persist. No findings for internal derangement. No meniscal tears. The knee ligaments are intact  and the quadriceps and patellar tendons are intact. IMPRESSION: 1. Open wound on the dorsal aspect of the knee overlying the patella. Associated septic prepatellar bursitis/abscess and significant surrounding inflammation/edema/cellulitis surrounding the entire knee. 2. Small knee joint effusion but I do not see any significant synovial enhancement, destructive cartilage changes or other findings suspicious for septic arthritis. 3. Patchy abnormal T1 and T2 signal intensity in the metadiaphyseal region of the femur could be reactive change or residual red marrow. After appropriate treatment for the septic prepatellar bursitis/abscess repeat imaging may be helpful if symptoms persist. 4. No findings for internal derangement. Electronically Signed   By: Marijo Sanes M.D.   On: 10/29/2022 07:12   DG Knee 2 Views Left  Result Date: 10/28/2022 CLINICAL DATA:  Left leg cellulitis. EXAM: LEFT KNEE - 1-2 VIEW COMPARISON:  10/25/2022 FINDINGS: Mild osteoarthritic change of the left knee. Bony structures otherwise unremarkable without evidence of osteolysis to suggest infection. Moderate soft tissue swelling over the anterior soft tissues of the knee with suggestion of minimal mottled air within the prominent anterior soft tissues. Findings are compatible patient's known cellulitis. IMPRESSION: Moderate soft tissue swelling over the anterior soft tissues  of the knee with suggestion of minimal mottled subcutaneous air. Findings compatible patient's known cellulitis with associated soft tissue air which could be seen due to intervention versus gas-forming organism. Electronically Signed   By: Marin Olp M.D.   On: 10/28/2022 13:12   US Venous Img Lower  Left (DVT Study)  Result Date: 10/25/2022 CLINICAL DATA:  Swelling and pain x4 days EXAM: Choose 2 LOWER EXTREMITY VENOUS DOPPLER ULTRASOUND TECHNIQUE: Gray-scale sonography with compression, as well as color and duplex ultrasound, were performed to evaluate the deep  venous system(s) from the level of the common femoral vein through the popliteal and proximal calf veins. COMPARISON:  None Available. FINDINGS: VENOUS Normal compressibility of the common femoral, superficial femoral, and popliteal veins, as well as the visualized calf veins. Visualized portions of profunda femoral vein and great saphenous vein unremarkable. No filling defects to suggest DVT on grayscale or color Doppler imaging. Doppler waveforms show normal direction of venous flow, normal respiratory plasticity and response to augmentation. Limited views of the contralateral common femoral vein are unremarkable. OTHER Subcutaneous edema in the calf. Morphologically unremarkable 1.1 cm left inguinal lymph node incidentally noted. Limitations: none IMPRESSION: Negative. Negative. Electronically Signed   By: Lucrezia Europe M.D.   On: 10/25/2022 10:37   DG Knee Complete 4 Views Left  Result Date: 10/25/2022 CLINICAL DATA:  swelling and pain EXAM: LEFT KNEE - COMPLETE 4+ VIEW COMPARISON:  None Available. FINDINGS: No evidence of fracture, dislocation, or joint effusion. No evidence of arthropathy or other focal bone abnormality. Soft tissues are unremarkable. IMPRESSION: Negative. Electronically Signed   By: Lucrezia Europe M.D.   On: 10/25/2022 09:56    Microbiology: Recent Results (from the past 240 hour(s))  Culture, blood (routine x 2)     Status: None   Collection Time: 10/25/22  9:50 AM   Specimen: BLOOD  Result Value Ref Range Status   Specimen Description BLOOD LEFT [facility]  Final   Special Requests   Final    BOTTLES DRAWN AEROBIC AND ANAEROBIC Blood Culture adequate volume   Culture   Final    NO GROWTH 5 DAYS Performed at [facility], [address] [place], [place] [address]    Report Status 10/30/2022 FINAL  Final  Culture, blood (routine x 2)     Status: None   Collection Time: 10/25/22  9:57 AM   Specimen: BLOOD  Result Value Ref Range Status   Specimen Description BLOOD RIGHT [facility]   Final   Special Requests   Final    BOTTLES DRAWN AEROBIC AND ANAEROBIC Blood Culture adequate volume   Culture   Final    NO GROWTH 5 DAYS Performed at [facility], [address], [place], [place] [address]    Report Status 10/30/2022 FINAL  Final  Blood Culture (routine x 2)     Status: None (Preliminary result)   Collection Time: 10/28/22 11:02 AM   Specimen: BLOOD  Result Value Ref Range Status   Specimen Description BLOOD RIGHT ANTECUBITAL  Final   Special Requests   Final    BOTTLES DRAWN AEROBIC AND ANAEROBIC Blood Culture adequate volume   Culture   Final    NO GROWTH 2 DAYS Performed at [facility], [address] [place], [place] [address]    Report Status PENDING  Incomplete  Blood Culture (routine x 2)     Status: None (Preliminary result)   Collection Time: 10/28/22 11:02 AM   Specimen: BLOOD RIGHT HAND  Result Value Ref Range Status   Specimen Description BLOOD RIGHT HAND  Final   Special Requests   Final    BOTTLES DRAWN AEROBIC AND ANAEROBIC Blood Culture adequate volume   Culture   Final    NO GROWTH 2 DAYS Performed at [facility], [address], [place], [place] [address]    Report Status PENDING  Incomplete  Aerobic/Anaerobic Culture w Gram Stain (surgical/deep wound)     Status: None (Preliminary result)   Collection Time: 10/29/22  2:54 PM   Specimen: PATH Other; Tissue  Result Value Ref Range Status   Specimen Description   Final    WOUND Performed at [facility], [address] [place], [place] [address]    Special Requests   Final    LEFT PREPATELLAR BURSA Performed at [facility], [address] [place], [place] [address]    Gram Stain   Final    FEW WBC PRESENT, PREDOMINANTLY MONONUCLEAR RARE GRAM POSITIVE COCCI IN CLUSTERS    Culture   Final    MODERATE STAPHYLOCOCCUS AUREUS CULTURE REINCUBATED FOR BETTER GROWTH Performed at [facility], [place] [address] [place],  [place] [address]    Report Status PENDING  Incomplete  Aerobic/Anaerobic Culture w Gram Stain (surgical/deep wound)     Status: None (Preliminary result)   Collection Time: 10/29/22  2:55 PM   Specimen: PATH Other; Tissue  Result Value Ref Range Status   Specimen Description   Final    WOUND Performed at [facility], [address], [place], [place] [address]    Special Requests   Final    LEFT PREPATELLAR BURSA 2 Performed at [facility], [address] [place], [place] [address]    Gram Stain   Final    FEW WBC PRESENT,BOTH PMN AND MONONUCLEAR RARE GRAM POSITIVE COCCI IN CLUSTERS    Culture   Final    ABUNDANT STAPHYLOCOCCUS AUREUS CULTURE REINCUBATED FOR BETTER GROWTH Performed at [facility], [place] [address] [place], [place] [address]    Report Status PENDING  Incomplete  Aerobic/Anaerobic Culture w Gram Stain (surgical/deep wound)     Status: None (Preliminary result)   Collection Time: 10/29/22  2:56 PM   Specimen: PATH Other; Tissue  Result Value Ref Range Status   Specimen Description   Final    WOUND Performed at [facility], [address] [place], [place] [address]    Special Requests   Final    LEFT PREPATELLAR BURSA 3 Performed at [facility], [place], [place] [address]    Gram Stain   Final    FEW WBC PRESENT, PREDOMINANTLY MONONUCLEAR FEW GRAM POSITIVE COCCI IN SINGLES    Culture   Final    ABUNDANT STAPHYLOCOCCUS AUREUS CULTURE REINCUBATED FOR BETTER GROWTH Performed at [facility], [place] [address], [place], [place] [address]    Report Status PENDING  Incomplete     Labs: CBC: Recent Labs  Lab 10/25/22 0957 10/28/22 0906 10/28/22 2016 10/29/22 0424  WBC 14.2* 14.8* 12.8* 11.3*  NEUTROABS 10.0* 10.3*  --   --   HGB 13.2 14.1 13.4 12.5*  HCT 40.6 43.7 40.6 37.6*  MCV 95.3 95.0 93.5 93.5  PLT 216 300 291 [id number]   Basic Metabolic Panel: Recent Labs  Lab 10/25/22 0957  10/28/22 0906 10/28/22 2016 10/29/22 0424  NA 134* 133* 134* 134*  K 3.9 4.1 4.2 4.0  CL 99 100  100 102  CO2 30 29 29 26   GLUCOSE 96 107* 98 96  BUN 11 10 9 8   CREATININE 0.73 0.63 0.71 0.53*  CALCIUM 8.7* 8.4* 8.2* 7.9*   Liver Function Tests: Recent Labs  Lab 10/28/22 0906 10/28/22 2016 10/29/22 0424  AST 56* 53* 45*  ALT 39 36 34  ALKPHOS 75 74 62  BILITOT 0.8 0.9 0.9  PROT 7.1 6.9 5.9*  ALBUMIN 3.2* 3.0* 2.6*   No results for input(s): "LIPASE", "AMYLASE" in the last 168 hours. No results for input(s): "AMMONIA" in the last 168 hours. Cardiac Enzymes: No results for input(s): "CKTOTAL", "CKMB", "CKMBINDEX", "TROPONINI" in the last 168 hours. BNP (last 3 results) No results for input(s): "BNP" in the last 8760 hours. CBG: No results for input(s): "GLUCAP" in the last 168 hours.  Time spent: 35 minutes  Signed:  Val Riles  [facility] 10/30/2022  2:53 PM

## 2022-10-30 NOTE — Anesthesia Postprocedure Evaluation (Signed)
Anesthesia Post Note  Patient: Cody Mercado  Procedure(s) Performed: INCISION AND DRAINAGE Left Knee (Left: Knee)  Patient location during evaluation: PACU Anesthesia Type: General Level of consciousness: awake and alert Pain management: pain level controlled Vital Signs Assessment: post-procedure vital signs reviewed and stable Respiratory status: spontaneous breathing, nonlabored ventilation, respiratory function stable and patient connected to nasal cannula oxygen Cardiovascular status: blood pressure returned to baseline and stable Postop Assessment: no apparent nausea or vomiting Anesthetic complications: no   No notable events documented.   Last Vitals:  Vitals:   10/29/22 1737 10/30/22 0023  BP: 104/66 (!) 106/56  Pulse: 66 (!) 59  Resp: 17 20  Temp: 36.6 C 36.8 C  SpO2: 100% 99%    Last Pain:  Vitals:   10/30/22 0349  TempSrc:   PainSc: 3                  [name]

## 2022-11-02 LAB — CULTURE, BLOOD (ROUTINE X 2)
Culture: NO GROWTH
Culture: NO GROWTH
Special Requests: ADEQUATE
Special Requests: ADEQUATE

## 2022-11-02 LAB — HEPATITIS B SURFACE ANTIBODY, QUANTITATIVE: Hep B S AB Quant (Post): <3.1 m[IU]/mL — ABNORMAL LOW (ref >9.9)

## 2022-11-03 LAB — AEROBIC/ANAEROBIC CULTURE W GRAM STAIN (SURGICAL/DEEP WOUND)

## 2022-11-16 ENCOUNTER — Encounter: Payer: Self-pay | Admitting: Orthopedic Surgery

## 2023-10-22 ENCOUNTER — Emergency Department
Admission: EM | Admit: 2023-10-22 | Discharge: 2023-10-23 | Disposition: A | Discharge status: Home / Self Care | Payer: Self-pay | Attending: Emergency Medicine | Admitting: Emergency Medicine

## 2023-10-22 DIAGNOSIS — F11922 Opioid use, unspecified with intoxication with perceptual disturbance: Secondary | ICD-10-CM

## 2023-10-22 DIAGNOSIS — X58XXXA Exposure to other specified factors, initial encounter: Secondary | ICD-10-CM | POA: Insufficient documentation

## 2023-10-22 DIAGNOSIS — T50901A Poisoning by unspecified drugs, medicaments and biological substances, accidental (unintentional), initial encounter: Secondary | ICD-10-CM | POA: Diagnosis present

## 2023-10-22 DIAGNOSIS — F1910 Other psychoactive substance abuse, uncomplicated: Secondary | ICD-10-CM

## 2023-10-22 DIAGNOSIS — F1190 Opioid use, unspecified, uncomplicated: Secondary | ICD-10-CM | POA: Insufficient documentation

## 2023-10-22 LAB — CBC
HCT: 40 % (ref 39.0–52.0)
Hemoglobin: 13 g/dL (ref 13.0–17.0)
MCH: 31.9 pg (ref 26.0–34.0)
MCHC: 32.5 g/dL (ref 30.0–36.0)
MCV: 98.3 fL (ref 80.0–100.0)
Platelets: 158 10*3/uL (ref 150–400)
RBC: 4.07 MIL/uL — ABNORMAL LOW (ref 4.22–5.81)
RDW: 13.9 % (ref 11.5–15.5)
WBC: 11.5 10*3/uL — ABNORMAL HIGH (ref 4.0–10.5)
nRBC: 0 % (ref 0.0–0.2)

## 2023-10-22 LAB — COMPREHENSIVE METABOLIC PANEL
ALT: 77 U/L — ABNORMAL HIGH (ref 0–44)
AST: 58 U/L — ABNORMAL HIGH (ref 15–41)
Albumin: 3.7 g/dL (ref 3.5–5.0)
Alkaline Phosphatase: 94 U/L (ref 38–126)
Anion gap: 5 (ref 5–15)
BUN: 16 mg/dL (ref 6–20)
CO2: 26 mmol/L (ref 22–32)
Calcium: 8.3 mg/dL — ABNORMAL LOW (ref 8.9–10.3)
Chloride: 108 mmol/L (ref 98–111)
Creatinine, Ser: 0.98 mg/dL (ref 0.61–1.24)
GFR, Estimated: >60 mL/min (ref >60)
Glucose, Bld: 123 mg/dL — ABNORMAL HIGH (ref 70–99)
Potassium: 4 mmol/L (ref 3.5–5.1)
Sodium: 139 mmol/L (ref 135–145)
Total Bilirubin: 0.5 mg/dL (ref 0.0–1.2)
Total Protein: 6.6 g/dL (ref 6.5–8.1)

## 2023-10-22 LAB — ETHANOL: Alcohol, Ethyl (B): <10 mg/dL (ref <10)

## 2023-10-22 NOTE — ED Notes (Signed)
 Wet clothing removed and placed in belonging bags. Patient was placed in clean gown and linens changed. Brief and chucks placed. Fall alarm in place and active. Monitor reapplied.

## 2023-10-22 NOTE — ED Triage Notes (Signed)
 Pt brought in by EMS from home - friend found pt down unresponsive. On EMS arrival pt had apneic breathing and bagging was initiated, Narcan was given with response. On arrival to ED pt is somnolent with response to voice. Snoring respirations on [address]L [address]. VSS. Per EMS unknown substance.

## 2023-10-22 NOTE — ED Provider Notes (Signed)
 [facility] Provider Note    Event Date/Time   First MD Initiated Contact with Patient 10/22/23 2303     (approximate)   History   Drug Overdose   HPI Cody Mercado is a 53 y.o. male with opioid use disorder presenting today for drug overdose.  EMS was called to the house when patient was found down unresponsive by a friend.  On their arrival, he was apneic and initially bagged.  He was given Narcan with appropriate response.  On arrival he had snoring respirations but otherwise no acute abnormality seen on exam.  He is lethargic but will arouse to verbal and physical stimuli.     Physical Exam   Triage Vital Signs: ED Triage Vitals [10/22/23 2232]  Encounter Vitals Group     BP 115/72     Systolic BP Percentile      Diastolic BP Percentile      Pulse Rate 74     Resp 10     Temp (!) 97.3 F (36.3 C)     Temp Source Oral     SpO2 98 %     Weight      Height      Head Circumference      Peak Flow      Pain Score      Pain Loc      Pain Education      Exclude from Growth Chart     Most recent vital signs: Vitals:   10/23/23 0400 10/23/23 0430  BP: 107/62 126/68  Pulse: (!) 59 (!) 50  Resp: 11 (!) 23  Temp:    SpO2: 98% 98%   I have reviewed the vital signs. General: Sleeping with snoring respirations but does respond to verbal and physical stimuli Head:  Normocephalic, Atraumatic. EENT:  PERRL, EOMI, Oral mucosa pink and moist, Neck is supple.  Pinpoint pupils bilaterally. Cardiovascular: Regular rate, 2+ distal pulses. Respiratory:  Normal respiratory effort, symmetrical expansion, no distress.   Extremities:  Moving all four extremities through full ROM without pain.   Neuro:  Alert and oriented.  Interacting appropriately.   Skin:  Warm, dry, no Mercado.   Psych: Appropriate affect.    ED Results / Procedures / Treatments   Labs (all labs ordered are listed, but only abnormal results are displayed) Labs Reviewed   COMPREHENSIVE METABOLIC PANEL - Abnormal; Notable for the following components:      Result Value   Glucose, Bld 123 (*)    Calcium 8.3 (*)    AST 58 (*)    ALT 77 (*)    All other components within normal limits  CBC - Abnormal; Notable for the following components:   WBC 11.5 (*)    RBC 4.07 (*)    All other components within normal limits  URINE DRUG SCREEN, QUALITATIVE ([facility] ONLY) - Abnormal; Notable for the following components:   Amphetamines, Ur Screen POSITIVE (*)    Cocaine Metabolite,Ur [place] POSITIVE (*)    Cannabinoid 50 Ng, Ur [place] POSITIVE (*)    All other components within normal limits  URINALYSIS, ROUTINE W REFLEX MICROSCOPIC - Abnormal; Notable for the following components:   Color, Urine YELLOW (*)    APPearance CLEAR (*)    Protein, ur 30 (*)    All other components within normal limits  SALICYLATE LEVEL - Abnormal; Notable for the following components:   Salicylate Lvl <7.0 (*)    All other components within normal limits  ACETAMINOPHEN LEVEL - Abnormal; Notable for the following components:   Acetaminophen (Tylenol), Serum <10 (*)    All other components within normal limits  ETHANOL     EKG My EKG interpretation: Rate of 83, normal sinus rhythm, normal axis, normal intervals.  No acute ST elevations or depressions   RADIOLOGY    PROCEDURES:  Critical Care performed: No  Procedures   MEDICATIONS ORDERED IN ED: Medications  naloxone (NARCAN) injection 0.1 mg (0.1 mg Intravenous Given 10/23/23 0150)  naloxone [facility]) injection 0.4 mg (0.4 mg Intravenous Given 10/23/23 0533)  ondansetron (ZOFRAN) injection 4 mg (4 mg Intravenous Given 10/23/23 0540)     IMPRESSION / MDM / ASSESSMENT AND PLAN / ED COURSE  I reviewed the triage vital signs and the nursing notes.                              Differential diagnosis includes, but is not limited to, opioid overdose, polysubstance abuse  Patient's presentation is most consistent with acute  presentation with potential threat to life or bodily function.  Patient is a 53 year old male presenting today for suspected drug overdose with improvement following Narcan suggesting opioid use.  He has snoring respirations on my exam but will arouse briefly to verbal and physical stimuli.  End-tidal capnography on with good waveform.  No hypoxia.  EKG unremarkable and initial laboratory workup largely reassuring.  Will monitor in the ED until more alert.  Laboratory workup largely reassuring.  UDS ultimately positive for amphetamines, cocaine, and cannabinoids.  After the second dose of Narcan patient started to become more awake.  He has not been able to get up and walk around or consistently tolerate p.o. yet.  Will sign out to oncoming provider pending complete metabolization of the medications before discharge.  Patient otherwise knows where he is and what is going on at this time.  No indication for admission at this time.  The patient is on the cardiac monitor to evaluate for evidence of arrhythmia and/or significant heart rate changes. Clinical Course as of 10/23/23 0704  Wed Oct 23, 2023  0152 Patient's end-tidal was around 60 although he was intermittently waking him up.  His pupils appeared slightly pinpoint although it has been like that since he arrived.  Tried small additional dose of Narcan which did significantly wake patient up.  Will continue to monitor to ensure stability. [DW]  0211 Patient states that he took oxycodone.  Will continue to monitor in ED.  He is not awake enough at this time [DW]  0538 Additional Narcan given.  Patient much more alert and pupils are no longer pinpoint.  Started having vomiting after waking up and given Zofran. [DW]  1610 Reassessed patient again.  Understands where he is.  Understands the situation.  Admits to likely drug overdose but unsure what he took.  Will p.o. challenge and make sure he can ambulate. [DW]    Clinical Course User Index [DW]  Janith Lima, MD     FINAL CLINICAL IMPRESSION(S) / ED DIAGNOSES   Final diagnoses:  Accidental drug overdose, initial encounter     Rx / DC Orders   ED Discharge Orders     None        Note:  This document was prepared using Dragon voice recognition software and may include unintentional dictation errors.   Janith Lima, MD 10/23/23 [phone]

## 2023-10-22 NOTE — ED Provider Notes (Signed)
 HPI: Pt is a 53 y.o. male who presents with complaints of overdose   The patient p/w  overdose-got narcan   ROS: Denies fever, chest pain, vomiting  No past medical history on file. Vitals:   10/22/23 2232  BP: 115/72  Pulse: 74  Resp: 10  Temp: (!) 97.3 F (36.3 C)  SpO2: 98%    Focused Physical Exam: Gen: pt sleeping  Head: atraumatic, normocephalic Eyes: Extraocular movements grossly intact; conjunctiva clear CV: RRR Lung: No increased WOB, no stridor GI: ND, no obvious masses Neuro: Alert and awake  Medical Decision Making and Plan: Given the patient's initial medical screening exam, the following diagnostic evaluation has been ordered. The patient will be placed in the appropriate treatment space, once one is available, to complete the evaluation and treatment. I have discussed the plan of care with the patient and I have advised the patient that an ED physician or mid-level practitioner will reevaluate their condition after the test results have been received, as the results may give them additional insight into the type of treatment they may need.   Diagnostics: Patient is on end-tidal with good end-tidal readings at this time although still sleepy.  Will get basic labs, EKG and patient can be seen by the next team.  Treatments: none immediately   Concha Se, MD 10/22/23 2238

## 2023-10-23 ENCOUNTER — Emergency Department: Payer: Self-pay

## 2023-10-23 LAB — URINE DRUG SCREEN, QUALITATIVE (ARMC ONLY)
Amphetamines, Ur Screen: POSITIVE — AB
Barbiturates, Ur Screen: NOT DETECTED
Benzodiazepine, Ur Scrn: NOT DETECTED
Cannabinoid 50 Ng, Ur ~~LOC~~: POSITIVE — AB
Cocaine Metabolite,Ur ~~LOC~~: POSITIVE — AB
MDMA (Ecstasy)Ur Screen: NOT DETECTED
Methadone Scn, Ur: NOT DETECTED
Opiate, Ur Screen: NOT DETECTED
Phencyclidine (PCP) Ur S: NOT DETECTED
Tricyclic, Ur Screen: NOT DETECTED

## 2023-10-23 LAB — URINALYSIS, ROUTINE W REFLEX MICROSCOPIC
Bacteria, UA: NONE SEEN
Bilirubin Urine: NEGATIVE
Glucose, UA: NEGATIVE mg/dL
Hgb urine dipstick: NEGATIVE
Ketones, ur: NEGATIVE mg/dL
Leukocytes,Ua: NEGATIVE
Nitrite: NEGATIVE
Protein, ur: 30 mg/dL — AB
Specific Gravity, Urine: 1.021 (ref 1.005–1.030)
pH: 5 (ref 5.0–8.0)

## 2023-10-23 LAB — SALICYLATE LEVEL: Salicylate Lvl: <7 mg/dL — ABNORMAL LOW (ref 7.0–30.0)

## 2023-10-23 LAB — ACETAMINOPHEN LEVEL: Acetaminophen (Tylenol), Serum: <10 ug/mL — ABNORMAL LOW (ref 10–30)

## 2023-10-23 MED ORDER — NALOXONE HCL 2 MG/2ML IJ SOSY
0.4000 mg | PREFILLED_SYRINGE | Freq: Once | INTRAMUSCULAR | Status: AC
  Administered 2023-10-23: 0.4 mg via INTRAVENOUS
  Filled 2023-10-23: qty 2

## 2023-10-23 MED ORDER — NALOXONE HCL 0.4 MG/ML IJ SOLN
0.1000 mg | Freq: Once | INTRAMUSCULAR | Status: AC
  Administered 2023-10-23: 0.1 mg via INTRAVENOUS
  Filled 2023-10-23: qty 1

## 2023-10-23 MED ORDER — ONDANSETRON HCL 4 MG/2ML IJ SOLN
4.0000 mg | Freq: Once | INTRAMUSCULAR | Status: AC
  Administered 2023-10-23: 4 mg via INTRAVENOUS
  Filled 2023-10-23: qty 2

## 2023-10-23 NOTE — ED Notes (Addendum)
 Pt responded to narcan - was able to wake up but unable answer questions appropriately. However this RN asked pt what drugs he took tonight and pt stated "oxycodone". Provider aware. Pt moving around the bed now and "stretching" , clapping his hands together and waving his arms. Nodding back to sleep.

## 2023-10-23 NOTE — ED Notes (Signed)
 Cody Mercado - patient contact on file was called by this RN and asked if friend was able to give him a ride home when he is ready for discharge. [name] said to give him a call when he is ready for discharge and he will be able to come give him a ride.

## 2023-10-23 NOTE — ED Notes (Signed)
 Pt responded to narcan and was able to answer questions appropriately. Pt told the doctor he "took a pill" unsure what it was. Pt awake - had sudden onset vomiting and was able to use the urinal to urinate. Pt cooperative.

## 2023-10-23 NOTE — ED Notes (Signed)
 Patient continuing to pull off monitor leads, BP cuff and pulse ox. Will wake up for short periods of time lasting 1-3 minutes. Patient asking to leave but then will fall asleep sitting up, starts moaning and then will hit himself in the head or clap his hands. Patient is not medically sober for discharge. MD Scotty Court made aware.

## 2023-10-23 NOTE — ED Provider Notes (Signed)
 Procedures  Clinical Course as of 10/23/23 1211  Wed Oct 23, 2023  0152 Patient's end-tidal was around 60 although he was intermittently waking him up.  His pupils appeared slightly pinpoint although it has been like that since he arrived.  Tried small additional dose of Narcan which did significantly wake patient up.  Will continue to monitor to ensure stability. [DW]  0211 Patient states that he took oxycodone.  Will continue to monitor in ED.  He is not awake enough at this time [DW]  0538 Additional Narcan given.  Patient much more alert and pupils are no longer pinpoint.  Started having vomiting after waking up and given Zofran. [DW]  1610 Reassessed patient again.  Understands where he is.  Understands the situation.  Admits to likely drug overdose but unsure what he took.  Will p.o. challenge and make sure he can ambulate. [DW]  9604 Still somnolent but easily arousable. Falls asleep in the middle of intentional action, syndrome resembling opioid methadone effect. Breathing comfortably, vitals normal. [PS]    Clinical Course User Index [DW] Janith Lima, MD [PS] Sharman Cheek, MD    ----------------------------------------- 12:11 PM on 10/23/2023 ----------------------------------------- Remained stable.  More alert.  Has a responsible adult friend who can come pick him up and is comfortable monitoring the patient until back to normal.  He is medically stable, no vomiting.  Tolerating p.o.     Sharman Cheek, MD 10/23/23 [phone]

## 2023-10-23 NOTE — ED Notes (Signed)
 Patient changed into paper scrubs; provided water. Will place in wheelchair for discharge.

## 2023-10-23 NOTE — ED Notes (Signed)
 Spoke to [place] (friend in chart), he states he will pick up patient in 1 hour.

## 2024-07-28 ENCOUNTER — Encounter: Payer: Self-pay | Admitting: Emergency Medicine

## 2024-07-28 ENCOUNTER — Emergency Department
Admission: EM | Admit: 2024-07-28 | Discharge: 2024-07-28 | Disposition: A | Discharge status: Home / Self Care | Payer: Self-pay | Attending: Emergency Medicine | Admitting: Emergency Medicine

## 2024-07-28 ENCOUNTER — Other Ambulatory Visit: Payer: Self-pay

## 2024-07-28 DIAGNOSIS — R202 Paresthesia of skin: Secondary | ICD-10-CM

## 2024-07-28 DIAGNOSIS — R7401 Elevation of levels of liver transaminase levels: Secondary | ICD-10-CM

## 2024-07-28 LAB — COMPREHENSIVE METABOLIC PANEL WITH GFR
ALT: 152 U/L — ABNORMAL HIGH (ref 0–44)
AST: 96 U/L — ABNORMAL HIGH (ref 15–41)
Albumin: 4.1 g/dL (ref 3.5–5.0)
Alkaline Phosphatase: 86 U/L (ref 38–126)
Anion gap: 6 (ref 5–15)
BUN: 12 mg/dL (ref 6–20)
CO2: 33 mmol/L — ABNORMAL HIGH (ref 22–32)
Calcium: 9.6 mg/dL (ref 8.9–10.3)
Chloride: 100 mmol/L (ref 98–111)
Creatinine, Ser: 0.87 mg/dL (ref 0.61–1.24)
GFR, Estimated: >60 mL/min (ref >60)
Glucose, Bld: 145 mg/dL — ABNORMAL HIGH (ref 70–99)
Potassium: 4 mmol/L (ref 3.5–5.1)
Sodium: 139 mmol/L (ref 135–145)
Total Bilirubin: 0.4 mg/dL (ref 0.0–1.2)
Total Protein: 7 g/dL (ref 6.5–8.1)

## 2024-07-28 LAB — CBC WITH DIFFERENTIAL/PLATELET
Abs Immature Granulocytes: 0.02 K/uL (ref 0.00–0.07)
Basophils Absolute: 0.1 K/uL (ref 0.0–0.1)
Basophils Relative: 1 %
Eosinophils Absolute: 0.4 K/uL (ref 0.0–0.5)
Eosinophils Relative: 5 %
HCT: 41.5 % (ref 39.0–52.0)
Hemoglobin: 13.8 g/dL (ref 13.0–17.0)
Immature Granulocytes: 0 %
Lymphocytes Relative: 36 %
Lymphs Abs: 3.5 K/uL (ref 0.7–4.0)
MCH: 31 pg (ref 26.0–34.0)
MCHC: 33.3 g/dL (ref 30.0–36.0)
MCV: 93.3 fL (ref 80.0–100.0)
Monocytes Absolute: 0.8 K/uL (ref 0.1–1.0)
Monocytes Relative: 8 %
Neutro Abs: 5 K/uL (ref 1.7–7.7)
Neutrophils Relative %: 50 %
Platelets: 165 K/uL (ref 150–400)
RBC: 4.45 MIL/uL (ref 4.22–5.81)
RDW: 13.2 % (ref 11.5–15.5)
WBC: 9.8 K/uL (ref 4.0–10.5)
nRBC: 0 % (ref 0.0–0.2)

## 2024-07-28 LAB — MAGNESIUM: Magnesium: 2.1 mg/dL (ref 1.7–2.4)

## 2024-07-28 MED ORDER — LACTATED RINGERS IV BOLUS
1000.0000 mL | Freq: Once | INTRAVENOUS | Status: AC
  Administered 2024-07-28: 09:00:00 1000 mL via INTRAVENOUS

## 2024-07-28 NOTE — ED Triage Notes (Signed)
 Feels like tingling to back of legs and body. Feels like something is crawling on the back of legs and body.  Also C/O chills and feeling cold. Denies cough, sore throat.  AAOx3. Skin warm and dry. Denies SI/HI. Denies hallucination.

## 2024-07-28 NOTE — Discharge Instructions (Addendum)
 Your testing today did not show any specific reason for the numbness and tingling in your legs.  Your blood work did show some elevation in your liver enzymes, which indicates inflammation around your liver.  You will need to follow-up with a primary care doctor for further testing related to your legs as well as repeat blood work checking on your liver.  You should avoid alcohol, drugs, or Tylenol  as these could cause further damage to your liver.  Please return to the ER for any new or worsening symptoms.

## 2024-07-28 NOTE — ED Triage Notes (Signed)
 First nurse note: Pt to ED ambulatory with coffee in hand. Pt reports tingling in lower extremities.

## 2024-07-28 NOTE — ED Notes (Signed)
 See triage note  Presents with tingling  States he just feels like he is tingling all over   Ambulates well Afebrile on arrival

## 2024-07-28 NOTE — ED Provider Notes (Signed)
 [facility] Provider Note    Event Date/Time   First MD Initiated Contact with Patient 07/28/24 [phone]     (approximate)   History   Chief Complaint Numbness   HPI  Cody Mercado is a 53 y.o. male with past medical history of opiate use disorder who presents to the ED complaining of numbness.  Patient reports that he has had waxing and waning numbness and tingling to his extremities for the past 3 days.  Symptoms initially started in his legs but have also begun to affect both arms.  He denies any pain or weakness in his arms or legs and has not noticed any skin discoloration.  He denies any vision changes, speech changes, or facial droop.  He denies any alcohol or drug use recently.     Physical Exam   Triage Vital Signs: ED Triage Vitals [07/28/24 0844]  Encounter Vitals Group     BP 116/67     Girls Systolic BP Percentile      Girls Diastolic BP Percentile      Boys Systolic BP Percentile      Boys Diastolic BP Percentile      Pulse Rate 64     Resp 16     Temp 98.4 F (36.9 C)     Temp Source Oral     SpO2 100 %     Weight 180 lb 12.4 oz (82 kg)     Height      Head Circumference      Peak Flow      Pain Score 0     Pain Loc      Pain Education      Exclude from Growth Chart     Most recent vital signs: Vitals:   07/28/24 0844  BP: 116/67  Pulse: 64  Resp: 16  Temp: 98.4 F (36.9 C)  SpO2: 100%    Constitutional: Alert and oriented. Eyes: Conjunctivae are normal. Head: Atraumatic. Nose: No congestion/rhinnorhea. Mouth/Throat: Mucous membranes are moist.  Cardiovascular: Normal rate, regular rhythm. Grossly normal heart sounds.  2+ radial and DP pulses bilaterally. Respiratory: Normal respiratory effort.  No retractions. Lungs CTAB. Gastrointestinal: Soft and nontender. No distention. Musculoskeletal: No lower extremity tenderness nor edema.  Neurologic:  Normal speech and language. No gross focal neurologic deficits are  appreciated.    ED Results / Procedures / Treatments   Labs (all labs ordered are listed, but only abnormal results are displayed) Labs Reviewed  COMPREHENSIVE METABOLIC PANEL WITH GFR - Abnormal; Notable for the following components:      Result Value   CO2 33 (*)    Glucose, Bld 145 (*)    AST 96 (*)    ALT 152 (*)    All other components within normal limits  CBC WITH DIFFERENTIAL/PLATELET  MAGNESIUM     EKG  ED ECG REPORT I, Carlin Palin, the attending physician, personally viewed and interpreted this ECG.   Date: 07/28/2024  EKG Time: 9:11  Rate: 59  Rhythm: normal sinus rhythm  Axis: Normal  Intervals:none  ST&T Change: None  PROCEDURES:  Critical Care performed: No  Procedures   MEDICATIONS ORDERED IN ED: Medications  lactated ringers  bolus 1,000 mL (0 mLs Intravenous Stopped 07/28/24 1017)     IMPRESSION / MDM / ASSESSMENT AND PLAN / ED COURSE  I reviewed the triage vital signs and the nursing notes.  53 y.o. male with past medical history of opiate use disorder who presents to the ED complaining of numbness and tingling in his extremities for the past 3 days.  Patient's presentation is most consistent with acute presentation with potential threat to life or bodily function.  Differential diagnosis includes, but is not limited to, dehydration, electrolyte abnormality, AKI, anemia, stroke, myelopathy, radiculopathy.  Patient nontoxic-appearing and in no acute distress, vital signs are unremarkable.  He is neurovascular intact to all 4 extremities with strong pulses and no evidence of weakness.  Doubt stroke or other intracranial process given generalized symptoms, will screen EKG and labs, hydrate with IV fluids.  Labs without significant anemia, leukocytosis, electrolyte abnormality, or AKI.  Patient does have mild transaminitis, slightly progressed from previous.  He denies any abdominal pain, nausea, or vomiting.   Bilirubin within normal limits and do not feel further workup or imaging needed for this at this time.  He is appropriate for outpatient follow-up and was encouraged to establish care with PCP for repeat LFTs and to return to the ED for new or worsening symptoms.  Patient agrees with plan.      FINAL CLINICAL IMPRESSION(S) / ED DIAGNOSES   Final diagnoses:  Paresthesia  Transaminitis     Rx / DC Orders   ED Discharge Orders     None        Note:  This document was prepared using Dragon voice recognition software and may include unintentional dictation errors.   Willo Dunnings, MD 07/28/24 1019

## 2024-08-06 ENCOUNTER — Emergency Department
Admission: EM | Admit: 2024-08-06 | Discharge: 2024-08-06 | Disposition: A | Discharge status: Home / Self Care | Payer: Self-pay | Attending: Emergency Medicine | Admitting: Emergency Medicine

## 2024-08-06 ENCOUNTER — Other Ambulatory Visit: Payer: Self-pay

## 2024-08-06 ENCOUNTER — Emergency Department: Payer: Self-pay

## 2024-08-06 DIAGNOSIS — M79604 Pain in right leg: Secondary | ICD-10-CM | POA: Insufficient documentation

## 2024-08-06 MED ORDER — CYCLOBENZAPRINE HCL 10 MG PO TABS
5.0000 mg | ORAL_TABLET | Freq: Once | ORAL | Status: AC
  Administered 2024-08-06: 5 mg via ORAL
  Filled 2024-08-06: qty 1

## 2024-08-06 MED ORDER — KETOROLAC TROMETHAMINE 30 MG/ML IJ SOLN
30.0000 mg | Freq: Once | INTRAMUSCULAR | Status: DC
  Filled 2024-08-06: qty 1

## 2024-08-06 MED ORDER — ACETAMINOPHEN 325 MG PO TABS
650.0000 mg | ORAL_TABLET | Freq: Once | ORAL | Status: AC
  Administered 2024-08-06: 650 mg via ORAL
  Filled 2024-08-06: qty 2

## 2024-08-06 NOTE — ED Provider Notes (Signed)
 "   [facility] Emergency Department Provider Note     Event Date/Time   First MD Initiated Contact with Patient 08/06/24 [phone]     (approximate)   History   Leg Pain   HPI  Cody Mercado is a 53 y.o. male with a past medical history of opioid use disorder.  Presents to the ED for evaluation of bilateral leg pain localized to posterior thigh with intermittent descending radiation.  Patient describes the pain as pulsating asking if I can feel this.  He states that it feels as if something is in my skin.  He denies any injuries or trauma.  He reports he also feels a sensation in his back.  Denies numbness or weakness in extremities.     Physical Exam   Triage Vital Signs: ED Triage Vitals  Encounter Vitals Group     BP 08/06/24 0933 (!) 142/81     Girls Systolic BP Percentile --      Girls Diastolic BP Percentile --      Boys Systolic BP Percentile --      Boys Diastolic BP Percentile --      Pulse Rate 08/06/24 0933 66     Resp 08/06/24 0933 18     Temp 08/06/24 0933 98.6 F (37 C)     Temp src --      SpO2 08/06/24 0933 100 %     Weight 08/06/24 0934 180 lb 12.4 oz (82 kg)     Height 08/06/24 0934 6' 3 (1.905 m)     Head Circumference --      Peak Flow --      Pain Score 08/06/24 0934 7     Pain Loc --      Pain Education --      Exclude from Growth Chart --     Most recent vital signs: Vitals:   08/06/24 0940 08/06/24 1255  BP:  (!) 140/80  Pulse:  62  Resp:  18  Temp:    SpO2: 100% 100%    General Awake, no distress.  Disheveled appearance. HEENT NCAT.  CV:  Good peripheral perfusion.  RESP:  Normal effort.  ABD:  No distention.  Other:  No deformity to bilateral legs.  There is no swelling, skin color changes.  Nontender to my palpation.  There is no pulsating movement on my palpation.  Lower extremity strength is equal and normal bilaterally.  Pedal pulses palpated and are 2+.  Gait is steady.  There is no pitting edema.   No lower extremity edema.  Nontender to palpation over midline lumbar region of spine.  Negative straight leg raise test bilaterally.   ED Results / Procedures / Treatments   Labs (all labs ordered are listed, but only abnormal results are displayed) Labs Reviewed - No data to display  RADIOLOGY  I personally viewed and evaluated these images as part of my medical decision making, as well as reviewing the written report by the radiologist.  US  Venous Img Lower Bilateral Result Date: 08/06/2024 EXAM: ULTRASOUND DUPLEX OF THE BILATERAL LOWER EXTREMITY VEINS TECHNIQUE: Duplex ultrasound using B-mode/gray scaled imaging and Doppler spectral analysis and color flow was obtained of the deep venous structures of the bilateral lower extremity. COMPARISON: US  Left Lower Extremity Venous 10/25/2022. CLINICAL HISTORY: 53 year old male with leg pain. FINDINGS: LEFT: The common femoral vein, femoral vein, popliteal vein, and visible calf veins demonstrate normal compressibility with normal color flow and spectral analysis. RIGHT: The common  femoral vein, femoral vein, popliteal vein, and visible calf veins demonstrate normal compressibility with normal color flow and spectral analysis. IMPRESSION: 1. No evidence of DVT in the bilateral lower extremities. Electronically signed by: Helayne Hurst MD 08/06/2024 12:44 PM EST RP Workstation: [id number]    PROCEDURES:  Critical Care performed: No  Procedures   MEDICATIONS ORDERED IN ED: Medications  ketorolac  (TORADOL ) 30 MG/ML injection 30 mg (30 mg Intramuscular Not Given 08/06/24 1216)  cyclobenzaprine  (FLEXERIL ) tablet 5 mg (5 mg Oral Given 08/06/24 1222)  acetaminophen  (TYLENOL ) tablet 650 mg (650 mg Oral Given 08/06/24 1217)     IMPRESSION / MDM / ASSESSMENT AND PLAN / ED COURSE  I reviewed the triage vital signs and the nursing notes.                                  53 y.o. male presents to the emergency department for evaluation and  treatment of bilateral leg pain. See HPI for further details.   Differential diagnosis includes, but is not limited to muscle strain, muscle spasm, doubt DVT but considered  Patient's presentation is most consistent with acute complicated illness / injury requiring diagnostic workup.  Patient is alert and oriented.  He is hemodynamic stable.  Physical exam findings are stated above.  No red flag signs.  Bilateral ultrasound is reassuring.  Patient stable condition for discharge home.  Referral to primary care ordered.  ED return precautions discussed.  FINAL CLINICAL IMPRESSION(S) / ED DIAGNOSES   Final diagnoses:  Bilateral leg pain   Rx / DC Orders   ED Discharge Orders          Ordered    Ambulatory Referral to Primary Care (Establish Care)        08/06/24 1248           Note:  This document was prepared using Dragon voice recognition software and may include unintentional dictation errors.    Margrette Monte A, PA-C 08/06/24 1257    Bradler, Evan K, MD 08/06/24 1500  "

## 2024-08-06 NOTE — ED Triage Notes (Signed)
 Pt to ED for left leg pain x3-4 days. Feels like its pulsating. Denies injuries

## 2024-08-06 NOTE — Discharge Instructions (Signed)
 You were evaluated in the ED for bilateral leg pain.  Your physical exam findings are reassuring.  Your ultrasound reveals no blood clot.  Please follow-up with your primary care provider for further evaluation.  A referral has been placed.  If any new or worsening symptoms occur please feel free to return to ED for further evaluation.  Pain control:  Ibuprofen (motrin/aleve/advil) - You can take 3 tablets (600 mg) every 6 hours as needed for pain.  Acetaminophen  (tylenol ) - You can take 2 extra strength tablets (1000 mg) every 6 hours as needed for pain.  You can alternate these medications or take them together.  Make sure you eat food/drink water when taking these medications.

## 2024-08-31 ENCOUNTER — Emergency Department (HOSPITAL_COMMUNITY)
Admission: EM | Admit: 2024-08-31 | Discharge: 2024-09-01 | Payer: Self-pay | Attending: Emergency Medicine | Admitting: Emergency Medicine

## 2024-08-31 ENCOUNTER — Other Ambulatory Visit: Payer: Self-pay

## 2024-08-31 ENCOUNTER — Encounter (HOSPITAL_COMMUNITY): Payer: Self-pay

## 2024-08-31 DIAGNOSIS — R21 Rash and other nonspecific skin eruption: Secondary | ICD-10-CM | POA: Insufficient documentation

## 2024-08-31 DIAGNOSIS — Z5321 Procedure and treatment not carried out due to patient leaving prior to being seen by health care provider: Secondary | ICD-10-CM | POA: Insufficient documentation

## 2024-08-31 NOTE — ED Triage Notes (Signed)
 Patient in ED today with complaints of itching and burning pain in both of his feet and legs. He states that he has a rash. Also he states that he looked it up and he thinks he has scabies.

## 2024-08-31 NOTE — ED Notes (Signed)
Pt left ED due to wait time.

## 2024-09-01 ENCOUNTER — Emergency Department
Admission: EM | Admit: 2024-09-01 | Discharge: 2024-09-01 | Disposition: A | Discharge status: Home / Self Care | Payer: Self-pay | Attending: Emergency Medicine | Admitting: Emergency Medicine

## 2024-09-01 DIAGNOSIS — R21 Rash and other nonspecific skin eruption: Secondary | ICD-10-CM | POA: Insufficient documentation

## 2024-09-01 MED ORDER — PERMETHRIN 5 % EX CREA: 1.0000 | TOPICAL_CREAM | Freq: Once | CUTANEOUS | 1 refills | Status: AC

## 2024-09-01 NOTE — ED Triage Notes (Signed)
 Pt was at [facility] yesterday for the same thing, of a rash that is on his body and that he read online that it is scabies. Pt sts that he got there at 1700 ish and then had to leave so he never got seen.

## 2024-09-01 NOTE — ED Provider Notes (Signed)
 "  [facility] Provider Note    Event Date/Time   First MD Initiated Contact with Patient 09/01/24 1656     (approximate)   History   Rash   HPI  Cody Mercado is a 54 y.o. male  with a past medical history of opioid use disorder, b/l leg paresthesias presents to the emergency department with rash to his legs and arms that has been present for roughly 1 week.  He reports it is extremely itchy and scratches himself repetitively. He reports symptoms are worse after sitting on his home furniture. Denies any new foods, detergents. He reports he recently stayed at a motel right before the symptoms started. No hx of eczema. Denies any numbness, weakness to his arms or legs. He states he tried OTC permethrin  cream 1% at home without relief of his pruritus.  Physical Exam   Triage Vital Signs: ED Triage Vitals  Encounter Vitals Group     BP 09/01/24 1507 (!) 123/99     Girls Systolic BP Percentile --      Girls Diastolic BP Percentile --      Boys Systolic BP Percentile --      Boys Diastolic BP Percentile --      Pulse Rate 09/01/24 1507 66     Resp 09/01/24 1507 17     Temp 09/01/24 1507 98.4 F (36.9 C)     Temp Source 09/01/24 1507 Oral     SpO2 09/01/24 1507 96 %     Weight 09/01/24 1508 175 lb (79.4 kg)     Height 09/01/24 1508 6' 2 (1.88 m)     Head Circumference --      Peak Flow --      Pain Score 09/01/24 1508 7     Pain Loc --      Pain Education --      Exclude from Growth Chart --     Most recent vital signs: Vitals:   09/01/24 1507  BP: (!) 123/99  Pulse: 66  Resp: 17  Temp: 98.4 F (36.9 C)  SpO2: 96%    General: Awake, in no acute distress. Appears stated age. CV: Good peripheral perfusion.  Respiratory:Normal respiratory effort.  No respiratory distress.  GI: Soft, non-distended. Skin:Warm, dry, intact. B/l upper and lower extremities appear flaky in nature with with linear burrows present on the flexor side of b/l wrists,  several scattered finger web spaces.  ED Results / Procedures / Treatments   Labs (all labs ordered are listed, but only abnormal results are displayed) Labs Reviewed - No data to display   EKG     RADIOLOGY    PROCEDURES:  Critical Care performed: No  Procedures   MEDICATIONS ORDERED IN ED: Medications - No data to display   IMPRESSION / MDM / ASSESSMENT AND PLAN / ED COURSE  I reviewed the triage vital signs and the nursing notes.                              Differential diagnosis includes, but is not limited to, scabies, insect bite, atopic dermatitis, lice, bed bugs, neuropathy  Patient's presentation is most consistent with acute, uncomplicated illness.  Patient has a 54 year old male presenting with rash to bilateral upper and lower extremities.  He reports it is extremely itchy and has some linear burrows present in his bilateral upper extremities on the wrists and finger web spaces, likely consistent with  scabies. He is not vomiting. He is afebrile and well appearing. No respiratory distress. No concern for allergic reaction at this time. Will prescribe permethrin  cream 5% to use.  Discussed how to take the medication and did give 1 refill for treatment in 1 week as well.  Discussed how to wash all clothing in hot water.  Does not have a PCP so I did send a referral to establish with one and gave him resources to find one close to where he lives.  The patient may return to the emergency department for any new, worsening, or concerning symptoms. Patient was given the opportunity to ask questions; all questions were answered. Emergency department return precautions were discussed with the patient.  Patient is in agreement to the treatment plan.  Patient is stable for discharge.   FINAL CLINICAL IMPRESSION(S) / ED DIAGNOSES   Final diagnoses:  Rash     Rx / DC Orders   ED Discharge Orders          Ordered    permethrin  (ELIMITE ) 5 % cream   Once         09/01/24 1733    Ambulatory Referral to Primary Care (Establish Care)        09/01/24 1733             Note:  This document was prepared using Dragon voice recognition software and may include unintentional dictation errors.     Sheron Boykin, PA-C 09/01/24 1755  "

## 2024-09-01 NOTE — Discharge Instructions (Addendum)
 You are seen in the emergency department for possible scabies.  Apply the cream to the entire body neck down and leave on for 8 to 12 hours then wash it off.  I have sent in a refill so you can apply a second round in 1 week.  Anyone else in the household will need to be treated as well.  Apply over-the-counter Eucerin cream for itching and skin dryness.  Wash all clothes in hot water.  I have also given you referral to establish care with primary care.  Please look through the list of resources and give other offices call at your earliest convenience to establish care.

## 2024-09-02 ENCOUNTER — Other Ambulatory Visit: Payer: Self-pay

## 2024-09-02 ENCOUNTER — Emergency Department
Admission: EM | Admit: 2024-09-02 | Discharge: 2024-09-02 | Disposition: A | Discharge status: Home / Self Care | Payer: Self-pay | Attending: Emergency Medicine | Admitting: Emergency Medicine

## 2024-09-02 ENCOUNTER — Encounter: Payer: Self-pay | Admitting: Emergency Medicine

## 2024-09-02 DIAGNOSIS — R202 Paresthesia of skin: Secondary | ICD-10-CM | POA: Insufficient documentation

## 2024-09-02 DIAGNOSIS — R21 Rash and other nonspecific skin eruption: Secondary | ICD-10-CM | POA: Insufficient documentation

## 2024-09-02 MED ORDER — HYDROXYZINE HCL 25 MG PO TABS: 25.0000 mg | ORAL_TABLET | Freq: Three times a day (TID) | ORAL | 0 refills | Status: AC | PRN

## 2024-09-02 MED ORDER — HYDROCORTISONE 2.5 % EX CREA: TOPICAL_CREAM | Freq: Two times a day (BID) | CUTANEOUS | 0 refills | Status: AC

## 2024-09-02 NOTE — ED Provider Notes (Signed)
 "  [facility] Provider Note    Event Date/Time   First MD Initiated Contact with Patient 09/02/24 [phone]     (approximate)   History   Rash   HPI  Cody Mercado is a 54 y.o. male with history of opioid use disorder, heroin use, cellulitis/abscess and as listed in EMR presents to the emergency department for evaluation of persistent sensation of bugs crawling under skin. Symptoms ongoing for 2 weeks.  He was given a prescription for promethazine yesterday and reports that he applied it and left it on all day and applied more throughout the day when it got dried in certain areas.  He states that he uses over his entire body including his scalp.  Symptoms started after staying in a hotel room.  He feels that there are small bugs crawling under his skin and he can occasionally see them flying through the air in the hotel room.  He has laundered his clothing in hot water and placed them in trash bags overnight.  He has also advised the hotel staff who have changed the linens.  He has not visualized any active/off bugs.  He reports that his skin itches and burns persistently.SABRA     Physical Exam    Vitals:   09/02/24 0717  BP: (!) 117/90  Pulse: 65  Resp: 18  Temp: (!) 97.5 F (36.4 C)  SpO2: 99%    General: Awake, no distress.  CV:  Good peripheral perfusion.  Resp:  Normal effort.  Abd:  No distention.  Other:  Areas of excoriation noted to bilateral arms and lower legs. No rash noted between fingers. Areas between toes are erythematous but without appearance of rash.  No evidence of secondary infection.   ED Results / Procedures / Treatments   Labs (all labs ordered are listed, but only abnormal results are displayed)  Labs Reviewed - No data to display   EKG  Not indicated.   RADIOLOGY  Image and radiology report reviewed and interpreted by me. Radiology report consistent with the same.  Not indicated.  PROCEDURES:  Critical Care  performed: No  Procedures   MEDICATIONS ORDERED IN ED:  Medications - No data to display   IMPRESSION / MDM / ASSESSMENT AND PLAN / ED COURSE   I have reviewed the triage note and vital signs. Vital signs stable   Differential diagnosis includes, but is not limited to, scabies, formication, bed bugs  Patient's presentation is most consistent with acute illness / injury with system symptoms.  54 year old male presenting to the emergency department for second days due to sensation of bugs crawling under his skin.  On exam, it is difficult to accurately describe rash secondary to length of time since onset and excoriation.  Yesterday, he was treated with permethrin  and reports that he did use it as directed.  Today, he is describing sensation of ability to feel bugs crawling under his skin and believes that he sees them flying through the air in the hotel room.  He has not actually seen 1 of these but reports he can hear them dinging off the walls.  Patient denies auditory hallucinations, SI or HI.  Plan today will be to treat him with hydroxyzine  and hydrocortisone  cream for the pruritic areas on the extremities.  He was encouraged to call and schedule appointment with [facility] if the symptoms are not improving over the next few days.      FINAL CLINICAL IMPRESSION(S) / ED  DIAGNOSES   Final diagnoses:  Formication  Rash     Rx / DC Orders   ED Discharge Orders          Ordered    hydrOXYzine  (ATARAX ) 25 MG tablet  3 times daily PRN        09/02/24 0740    hydrocortisone  2.5 % cream  2 times daily        09/02/24 0740             Note:  This document was prepared using Dragon voice recognition software and may include unintentional dictation errors.   Herlinda Kirk NOVAK, FNP 09/02/24 9148    Willo Dunnings, MD 09/02/24 1449  "

## 2024-09-02 NOTE — ED Triage Notes (Signed)
 Pt via POV from home. Pt states he was dx with scabies yesterday morning, states he used all the prescribed medication they gave him yesterday and it still is not any better. Pt states they is a painful, itchy rash everywhere. Pt is A&Ox4 and NAD, ambulatory to triage with steady gait.

## 2024-09-02 NOTE — Discharge Instructions (Signed)
 Do not apply this cream to your face or underarms.  Take the medication as prescribed for itching/burning skin.  Make an appointment with [facility] if symptoms are not improving over the week.
